# Patient Record
Sex: Female | Born: 1975
Health system: Southern US, Community
[De-identification: ages and names within clinical notes are randomized; demographics above are authoritative.]

## PROBLEM LIST (undated history)

## (undated) DIAGNOSIS — S46911D Strain of unspecified muscle, fascia and tendon at shoulder and upper arm level, right arm, subsequent encounter: Secondary | ICD-10-CM

## (undated) DIAGNOSIS — T7840XA Allergy, unspecified, initial encounter: Secondary | ICD-10-CM

## (undated) DIAGNOSIS — C801 Malignant (primary) neoplasm, unspecified: Secondary | ICD-10-CM

## (undated) DIAGNOSIS — C4491 Basal cell carcinoma of skin, unspecified: Secondary | ICD-10-CM

## (undated) HISTORY — PX: SHOULDER SURGERY: SHX246

## (undated) HISTORY — DX: Allergy, unspecified, initial encounter: T78.40XA

---
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
## 2012-11-30 HISTORY — PX: CHOLECYSTECTOMY, LAPAROSCOPIC: SHX56

## 2014-11-30 HISTORY — PX: MOHS SURGERY: SUR867

## 2015-07-01 DIAGNOSIS — C801 Malignant (primary) neoplasm, unspecified: Secondary | ICD-10-CM

## 2015-07-01 HISTORY — DX: Malignant (primary) neoplasm, unspecified: C80.1

## 2017-02-25 ENCOUNTER — Encounter: Payer: Self-pay | Admitting: Internal Medicine

## 2017-03-01 ENCOUNTER — Ambulatory Visit (INDEPENDENT_AMBULATORY_CARE_PROVIDER_SITE_OTHER): Payer: Medicaid Other | Admitting: Internal Medicine

## 2017-03-01 ENCOUNTER — Encounter: Payer: Self-pay | Admitting: Internal Medicine

## 2017-03-01 VITALS — BP 128/78 | HR 84 | Ht 66.0 in | Wt 202.0 lb

## 2017-03-01 DIAGNOSIS — Z85828 Personal history of other malignant neoplasm of skin: Secondary | ICD-10-CM | POA: Diagnosis not present

## 2017-03-01 DIAGNOSIS — S56911A Strain of unspecified muscles, fascia and tendons at forearm level, right arm, initial encounter: Secondary | ICD-10-CM

## 2017-03-01 DIAGNOSIS — S46911A Strain of unspecified muscle, fascia and tendon at shoulder and upper arm level, right arm, initial encounter: Secondary | ICD-10-CM

## 2017-03-01 NOTE — Progress Notes (Signed)
Date:  03/01/2017   Name:  Becky Clarke   DOB:  09/04/1976   MRN:  [id number]   Chief Complaint: Establish Care (New to area- used to see PCP in [place]. Would like dermatology referral for skin on ear to be checked out- needs to accept Medicaid. ) and Skin Cancer Arm Pain   The incident occurred more than 1 week ago. 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 The pain is present in the right elbow. The quality of the pain is described as aching. Pertinent negatives include no chest pain, muscle weakness or numbness. The symptoms are aggravated by lifting. She has tried NSAIDs for the symptoms. The treatment provided mild relief.   She has MOHS surgery on right ear several years ago.  Had been urged to get regular skin exams but needs a practice that accepts medicaid.   Review of Systems  Constitutional: Negative for chills and fatigue.  HENT: Negative for ear pain.   Respiratory: Negative for chest tightness, shortness of breath and wheezing.   Cardiovascular: Negative for chest pain, palpitations and leg swelling.  Gastrointestinal: Negative for abdominal pain, constipation and diarrhea.  Genitourinary: Negative for menstrual problem.  Musculoskeletal: Positive for arthralgias (right forearm).  Skin: Negative for color change and rash.  Allergic/Immunologic: Negative for environmental allergies.  Neurological: Negative for numbness and headaches.  Psychiatric/Behavioral: Negative for sleep disturbance.    There are no active problems to display for this patient.   Prior to Admission medications   Not on File    No Known Allergies  Past Surgical History:  Procedure Laterality Date  . CHOLECYSTECTOMY, LAPAROSCOPIC  2014   gall bladder removed  . MOHS SURGERY  2016   on Rt ear  . SHOULDER SURGERY Right 1994 & 2014    Social History  Substance Use Topics  . Smoking status: Never Smoker  . Smokeless tobacco: Never Used  .  Alcohol use Yes     Comment: occasional     Medication list has been reviewed and updated.   Physical Exam  Constitutional: She is oriented to person, place, and time. She appears well-developed. No distress.  HENT:  Head: Normocephalic and atraumatic.  Neck: Normal range of motion. Neck supple.  Cardiovascular: Normal rate, regular rhythm and normal heart sounds.   Pulmonary/Chest: Effort normal and breath sounds normal. No respiratory distress. She has no wheezes.  Musculoskeletal: Normal range of motion. She exhibits no edema.       Right elbow: She exhibits no swelling and no effusion. Tenderness found.       Right wrist: She exhibits normal range of motion, no tenderness and no bony tenderness.  Neurological: She is alert and oriented to person, place, and time.  Skin: Skin is warm and dry. No rash noted.  Psychiatric: She has a normal mood and affect. Her behavior is normal. Thought content normal.  Nursing note and vitals reviewed.   BP 128/78 (BP Location: Right Arm, Patient Position: Sitting, Cuff Size: Normal)   Pulse 84   Ht 5\' 6"  (1.676 m)   Wt 202 lb (91.6 kg)   LMP 02/07/2017   SpO2 100%   BMI 32.60 kg/m   Assessment and Plan: 1. Hx of skin cancer, basal cell Patient will call case worker for covered Dermatologist   2. Strain of right elbow, initial encounter Continue Advil, use ice or heat Soft brace to avoid hyperextension   No orders of the defined types were  placed in this encounter.   [name], MD [facility]  03/01/2017

## 2017-03-01 NOTE — Patient Instructions (Signed)
Breast Self-Awareness Breast self-awareness means being familiar with how your breasts look and feel. It involves checking your breasts regularly and reporting any changes to your health care provider. Practicing breast self-awareness is important. A change in your breasts can be a sign of a serious medical problem. Being familiar with how your breasts look and feel allows you to find any problems early, when treatment is more likely to be successful. All women should practice breast self-awareness, including women who have had breast implants. How to do a breast self-exam One way to learn what is normal for your breasts and whether your breasts are changing is to do a breast self-exam. To do a breast self-exam: Look for Changes   1. Remove all the clothing above your waist. 2. Stand in front of a mirror in a room with good lighting. 3. Put your hands on your hips. 4. Push your hands firmly downward. 5. Compare your breasts in the mirror. Look for differences between them (asymmetry), such as:  Differences in shape.  Differences in size.  Puckers, dips, and bumps in one breast and not the other. 6. Look at each breast for changes in your skin, such as:  Redness.  Scaly areas. 7. Look for changes in your nipples, such as:  Discharge.  Bleeding.  Dimpling.  Redness.  A change in position. Feel for Changes   Carefully feel your breasts for lumps and changes. It is best to do this while lying on your back on the floor and again while sitting or standing in the shower or tub with soapy water on your skin. Feel each breast in the following way:  Place the arm on the side of the breast you are examining above your head.  Feel your breast with the other hand.  Start in the nipple area and make  inch (2 cm) overlapping circles to feel your breast. Use the pads of your three middle fingers to do this. Apply light pressure, then medium pressure, then firm pressure. The light pressure  will allow you to feel the tissue closest to the skin. The medium pressure will allow you to feel the tissue that is a little deeper. The firm pressure will allow you to feel the tissue close to the ribs.  Continue the overlapping circles, moving downward over the breast until you feel your ribs below your breast.  Move one finger-width toward the center of the body. Continue to use the  inch (2 cm) overlapping circles to feel your breast as you move slowly up toward your collarbone.  Continue the up and down exam using all three pressures until you reach your armpit. Write Down What You Find   Write down what is normal for each breast and any changes that you find. Keep a written record with breast changes or normal findings for each breast. By writing this information down, you do not need to depend only on memory for size, tenderness, or location. Write down where you are in your menstrual cycle, if you are still menstruating. If you are having trouble noticing differences in your breasts, do not get discouraged. With time you will become more familiar with the variations in your breasts and more comfortable with the exam. How often should I examine my breasts? Examine your breasts every month. If you are breastfeeding, the best time to examine your breasts is after a feeding or after using a breast pump. If you menstruate, the best time to examine your breasts is 5-7 days  after your period is over. During your period, your breasts are lumpier, and it may be more difficult to notice changes. When should I see my health care provider? See your health care provider if you notice:  A change in shape or size of your breasts or nipples.  A change in the skin of your breast or nipples, such as a reddened or scaly area.  Unusual discharge from your nipples.  A lump or thick area that was not there before.  Pain in your breasts.  Anything that concerns you. This information is not intended to  replace advice given to you by your health care provider. Make sure you discuss any questions you have with your health care provider. Document Released: 11/16/2005 Document Revised: 04/23/2016 Document Reviewed: 10/06/2015 Elsevier Interactive Patient Education  2017 [employer].

## 2017-03-11 ENCOUNTER — Other Ambulatory Visit: Payer: Self-pay | Admitting: Internal Medicine

## 2017-03-11 ENCOUNTER — Encounter: Payer: Self-pay | Admitting: Internal Medicine

## 2017-03-11 DIAGNOSIS — Z85828 Personal history of other malignant neoplasm of skin: Secondary | ICD-10-CM

## 2017-03-25 ENCOUNTER — Ambulatory Visit (INDEPENDENT_AMBULATORY_CARE_PROVIDER_SITE_OTHER): Payer: Medicaid Other | Admitting: Internal Medicine

## 2017-03-25 ENCOUNTER — Encounter: Payer: Self-pay | Admitting: Internal Medicine

## 2017-03-25 VITALS — BP 110/76 | HR 65 | Ht 66.0 in | Wt 195.6 lb

## 2017-03-25 DIAGNOSIS — S46911D Strain of unspecified muscle, fascia and tendon at shoulder and upper arm level, right arm, subsequent encounter: Secondary | ICD-10-CM

## 2017-03-25 DIAGNOSIS — Z1231 Encounter for screening mammogram for malignant neoplasm of breast: Secondary | ICD-10-CM

## 2017-03-25 DIAGNOSIS — S56911D Strain of unspecified muscles, fascia and tendons at forearm level, right arm, subsequent encounter: Secondary | ICD-10-CM

## 2017-03-25 DIAGNOSIS — Z0000 Encounter for general adult medical examination without abnormal findings: Secondary | ICD-10-CM | POA: Diagnosis not present

## 2017-03-25 DIAGNOSIS — Z1239 Encounter for other screening for malignant neoplasm of breast: Secondary | ICD-10-CM

## 2017-03-25 HISTORY — DX: Strain of unspecified muscles, fascia and tendons at forearm level, right arm, subsequent encounter: S56.911D

## 2017-03-25 HISTORY — DX: Strain of unspecified muscle, fascia and tendon at shoulder and upper arm level, right arm, subsequent encounter: S46.911D

## 2017-03-25 LAB — POCT URINALYSIS DIPSTICK
Bilirubin, UA: NEGATIVE
Blood, UA: NEGATIVE
Glucose, UA: NEGATIVE
Ketones, UA: NEGATIVE
Leukocytes, UA: NEGATIVE
Nitrite, UA: NEGATIVE
Protein, UA: NEGATIVE
Spec Grav, UA: <=1.005 — AB (ref 1.010–1.025)
Urobilinogen, UA: 0.2 E.U./dL
pH, UA: 7.5 (ref 5.0–8.0)

## 2017-03-25 NOTE — Progress Notes (Signed)
Date:  03/25/2017   Name:  Becky Clarke   DOB:  August 22, 1976   MRN:  [id number]   Chief Complaint: Annual Exam (Wants pap today. Unsure of when last pap was. Breast Exam. ) Becky Clarke is a 41 y.o. female who presents today for her Complete Annual Exam. She feels well. She reports exercising - started back walking. She reports she is sleeping well. Her elbow is improving but still painful with certain movements and extension.    Review of Systems  Constitutional: Negative for chills, fatigue and fever.  HENT: Negative for congestion, hearing loss, tinnitus, trouble swallowing and voice change.   Eyes: Negative for visual disturbance.  Respiratory: Negative for cough, chest tightness, shortness of breath and wheezing.   Cardiovascular: Negative for chest pain, palpitations and leg swelling.  Gastrointestinal: Negative for abdominal pain, constipation, diarrhea and vomiting.  Endocrine: Negative for polydipsia and polyuria.  Genitourinary: Negative for dysuria, frequency, genital sores, menstrual problem, vaginal bleeding and vaginal discharge.  Musculoskeletal: Positive for arthralgias (right elbow). Negative for gait problem and joint swelling.  Skin: Negative for color change and rash.  Allergic/Immunologic: Positive for environmental allergies.  Neurological: Negative for dizziness, tremors, light-headedness and headaches.  Hematological: Negative for adenopathy. Does not bruise/bleed easily.  Psychiatric/Behavioral: Negative for dysphoric mood and sleep disturbance. The patient is not nervous/anxious.     Patient Active Problem List   Diagnosis Date Noted  . History of basal cell cancer 03/11/2017    Prior to Admission medications   Not on File    No Known Allergies  Past Surgical History:  Procedure Laterality Date  . CHOLECYSTECTOMY, LAPAROSCOPIC  2014   gall bladder removed  . MOHS SURGERY  2016   on Rt ear  . SHOULDER SURGERY Right 1994 & 2014     Social History  Substance Use Topics  . Smoking status: Never Smoker  . Smokeless tobacco: Never Used  . Alcohol use Yes     Comment: occasional     Medication list has been reviewed and updated.   Physical Exam  Constitutional: She is oriented to person, place, and time. She appears well-developed and well-nourished. No distress.  HENT:  Head: Normocephalic and atraumatic.  Right Ear: Tympanic membrane and ear canal normal.  Left Ear: Tympanic membrane and ear canal normal.  Nose: Right sinus exhibits no maxillary sinus tenderness. Left sinus exhibits no maxillary sinus tenderness.  Mouth/Throat: Uvula is midline and oropharynx is clear and moist.  Eyes: Conjunctivae and EOM are normal. Right eye exhibits no discharge. Left eye exhibits no discharge. No scleral icterus.  Neck: Normal range of motion. Carotid bruit is not present. No erythema present. No thyromegaly present.  Cardiovascular: Normal rate, regular rhythm, normal heart sounds and normal pulses.   Pulmonary/Chest: Effort normal. No respiratory distress. She has no wheezes. Right breast exhibits no mass, no nipple discharge, no skin change and no tenderness. Left breast exhibits no mass, no nipple discharge, no skin change and no tenderness.  Abdominal: Soft. Bowel sounds are normal. There is no hepatosplenomegaly. There is no tenderness. There is no CVA tenderness.  Genitourinary: Vagina normal and uterus normal. There is no tenderness, lesion or injury on the right labia. There is no tenderness, lesion or injury on the left labia. Cervix exhibits no motion tenderness, no discharge and no friability. Right adnexum displays no mass, no tenderness and no fullness. Left adnexum displays no mass, no tenderness and no fullness.  Genitourinary Comments: Few tiny nabothian cysts  at 3 and 4 oclock  Musculoskeletal: Normal range of motion.       Right elbow: She exhibits normal range of motion, no swelling and no effusion.   Lymphadenopathy:    She has no cervical adenopathy.    She has no axillary adenopathy.  Neurological: She is alert and oriented to person, place, and time. She has normal reflexes. No cranial nerve deficit or sensory deficit.  Skin: Skin is warm, dry and intact. No rash noted.  Psychiatric: She has a normal mood and affect. Her speech is normal and behavior is normal. Thought content normal.  Nursing note and vitals reviewed.   BP 110/76 (BP Location: Right Arm, Patient Position: Sitting, Cuff Size: Large)   Pulse 65   Ht 5\' 6"  (1.676 m)   Wt 195 lb 9.6 oz (88.7 kg)   LMP 03/12/2017 (Exact Date)   SpO2 100%   BMI 31.57 kg/m   Assessment and Plan: 1. Annual physical exam Normal exam other than weight - continue active lifestyle and work on diet - CBC with Differential/Platelet - Comprehensive metabolic panel - Lipid panel - TSH - POCT urinalysis dipstick - Pap IG and HPV (high risk) DNA detection  2. Breast cancer screening Normal exam - due for annual screening - MM DIGITAL SCREENING BILATERAL; Future  3. Elbow strain, right, subsequent encounter Improving - can refer to Ortho if needed   No orders of the defined types were placed in this encounter.   Becky Maidens, MD [facility]  03/25/2017

## 2017-03-25 NOTE — Patient Instructions (Signed)
Call to schedule Mammogram  [phone]   Breast Self-Awareness Breast self-awareness means being familiar with how your breasts look and feel. It involves checking your breasts regularly and reporting any changes to your health care provider. Practicing breast self-awareness is important. A change in your breasts can be a sign of a serious medical problem. Being familiar with how your breasts look and feel allows you to find any problems early, when treatment is more likely to be successful. All women should practice breast self-awareness, including women who have had breast implants. How to do a breast self-exam One way to learn what is normal for your breasts and whether your breasts are changing is to do a breast self-exam. To do a breast self-exam: Look for Changes   1. Remove all the clothing above your waist. 2. Stand in front of a mirror in a room with good lighting. 3. Put your hands on your hips. 4. Push your hands firmly downward. 5. Compare your breasts in the mirror. Look for differences between them (asymmetry), such as:  Differences in shape.  Differences in size.  Puckers, dips, and bumps in one breast and not the other. 6. Look at each breast for changes in your skin, such as:  Redness.  Scaly areas. 7. Look for changes in your nipples, such as:  Discharge.  Bleeding.  Dimpling.  Redness.  A change in position. Feel for Changes   Carefully feel your breasts for lumps and changes. It is best to do this while lying on your back on the floor and again while sitting or standing in the shower or tub with soapy water on your skin. Feel each breast in the following way:  Place the arm on the side of the breast you are examining above your head.  Feel your breast with the other hand.  Start in the nipple area and make  inch (2 cm) overlapping circles to feel your breast. Use the pads of your three middle fingers to do this. Apply light pressure, then medium  pressure, then firm pressure. The light pressure will allow you to feel the tissue closest to the skin. The medium pressure will allow you to feel the tissue that is a little deeper. The firm pressure will allow you to feel the tissue close to the ribs.  Continue the overlapping circles, moving downward over the breast until you feel your ribs below your breast.  Move one finger-width toward the center of the body. Continue to use the  inch (2 cm) overlapping circles to feel your breast as you move slowly up toward your collarbone.  Continue the up and down exam using all three pressures until you reach your armpit. Write Down What You Find   Write down what is normal for each breast and any changes that you find. Keep a written record with breast changes or normal findings for each breast. By writing this information down, you do not need to depend only on memory for size, tenderness, or location. Write down where you are in your menstrual cycle, if you are still menstruating. If you are having trouble noticing differences in your breasts, do not get discouraged. With time you will become more familiar with the variations in your breasts and more comfortable with the exam. How often should I examine my breasts? Examine your breasts every month. If you are breastfeeding, the best time to examine your breasts is after a feeding or after using a breast pump. If you menstruate, the best  time to examine your breasts is 5-7 days after your period is over. During your period, your breasts are lumpier, and it may be more difficult to notice changes. When should I see my health care provider? See your health care provider if you notice:  A change in shape or size of your breasts or nipples.  A change in the skin of your breast or nipples, such as a reddened or scaly area.  Unusual discharge from your nipples.  A lump or thick area that was not there before.  Pain in your breasts.  Anything that  concerns you. This information is not intended to replace advice given to you by your health care provider. Make sure you discuss any questions you have with your health care provider. Document Released: 11/16/2005 Document Revised: 04/23/2016 Document Reviewed: 10/06/2015 Elsevier Interactive Patient Education  2017 [employer].

## 2017-03-26 LAB — CBC WITH DIFFERENTIAL/PLATELET
Basophils Absolute: 0 10*3/uL (ref 0.0–0.2)
Basos: 0 %
EOS (ABSOLUTE): 0.1 10*3/uL (ref 0.0–0.4)
Eos: 1 %
Hematocrit: 43 % (ref 34.0–46.6)
Hemoglobin: 14.6 g/dL (ref 11.1–15.9)
Immature Grans (Abs): 0 10*3/uL (ref 0.0–0.1)
Immature Granulocytes: 0 %
Lymphocytes Absolute: 2.4 10*3/uL (ref 0.7–3.1)
Lymphs: 37 %
MCH: 31.4 pg (ref 26.6–33.0)
MCHC: 34 g/dL (ref 31.5–35.7)
MCV: 93 fL (ref 79–97)
Monocytes Absolute: 0.4 10*3/uL (ref 0.1–0.9)
Monocytes: 6 %
Neutrophils Absolute: 3.6 10*3/uL (ref 1.4–7.0)
Neutrophils: 56 %
Platelets: 289 10*3/uL (ref 150–379)
RBC: 4.65 x10E6/uL (ref 3.77–5.28)
RDW: 13.1 % (ref 12.3–15.4)
WBC: 6.5 10*3/uL (ref 3.4–10.8)

## 2017-03-26 LAB — COMPREHENSIVE METABOLIC PANEL
ALT: 23 IU/L (ref 0–32)
AST: 19 IU/L (ref 0–40)
Albumin/Globulin Ratio: 1.6 (ref 1.2–2.2)
Albumin: 4.6 g/dL (ref 3.5–5.5)
Alkaline Phosphatase: 59 IU/L (ref 39–117)
BUN/Creatinine Ratio: 9 (ref 9–23)
BUN: 8 mg/dL (ref 6–24)
Bilirubin Total: 0.6 mg/dL (ref 0.0–1.2)
CO2: 21 mmol/L (ref 18–29)
Calcium: 9.5 mg/dL (ref 8.7–10.2)
Chloride: 100 mmol/L (ref 96–106)
Creatinine, Ser: 0.87 mg/dL (ref 0.57–1.00)
GFR calc Af Amer: 94 mL/min/{1.73_m2} (ref >59)
GFR calc non Af Amer: 82 mL/min/{1.73_m2} (ref >59)
Globulin, Total: 2.9 g/dL (ref 1.5–4.5)
Glucose: 75 mg/dL (ref 65–99)
Potassium: 3.9 mmol/L (ref 3.5–5.2)
Sodium: 140 mmol/L (ref 134–144)
Total Protein: 7.5 g/dL (ref 6.0–8.5)

## 2017-03-26 LAB — LIPID PANEL
Chol/HDL Ratio: 2.8 ratio (ref 0.0–4.4)
Cholesterol, Total: 197 mg/dL (ref 100–199)
HDL: 71 mg/dL (ref >39)
LDL Calculated: 108 mg/dL — ABNORMAL HIGH (ref 0–99)
Triglycerides: 90 mg/dL (ref 0–149)
VLDL Cholesterol Cal: 18 mg/dL (ref 5–40)

## 2017-03-26 LAB — TSH: TSH: 1.66 u[IU]/mL (ref 0.450–4.500)

## 2017-03-30 LAB — PAP IG AND HPV HIGH-RISK
HPV, high-risk: NEGATIVE
PAP Smear Comment: 0

## 2017-04-01 ENCOUNTER — Ambulatory Visit
Admission: RE | Admit: 2017-04-01 | Discharge: 2017-04-01 | Disposition: A | Discharge status: Home / Self Care | Payer: Self-pay | Source: Ambulatory Visit | Attending: Internal Medicine | Admitting: Internal Medicine

## 2017-04-01 ENCOUNTER — Encounter: Payer: Self-pay | Admitting: Radiology

## 2017-04-01 DIAGNOSIS — Z1239 Encounter for other screening for malignant neoplasm of breast: Secondary | ICD-10-CM

## 2017-04-01 DIAGNOSIS — Z1231 Encounter for screening mammogram for malignant neoplasm of breast: Secondary | ICD-10-CM | POA: Insufficient documentation

## 2017-04-01 HISTORY — DX: Malignant (primary) neoplasm, unspecified: C80.1

## 2017-08-22 IMAGING — MG MM DIGITAL SCREENING BILAT W/ CAD
4 series · 4 of 4 positions shown · non-contrast
Comparison: None.

CLINICAL DATA: Screening.

EXAM:
DIGITAL SCREENING BILATERAL MAMMOGRAM WITH CAD

[R CC]
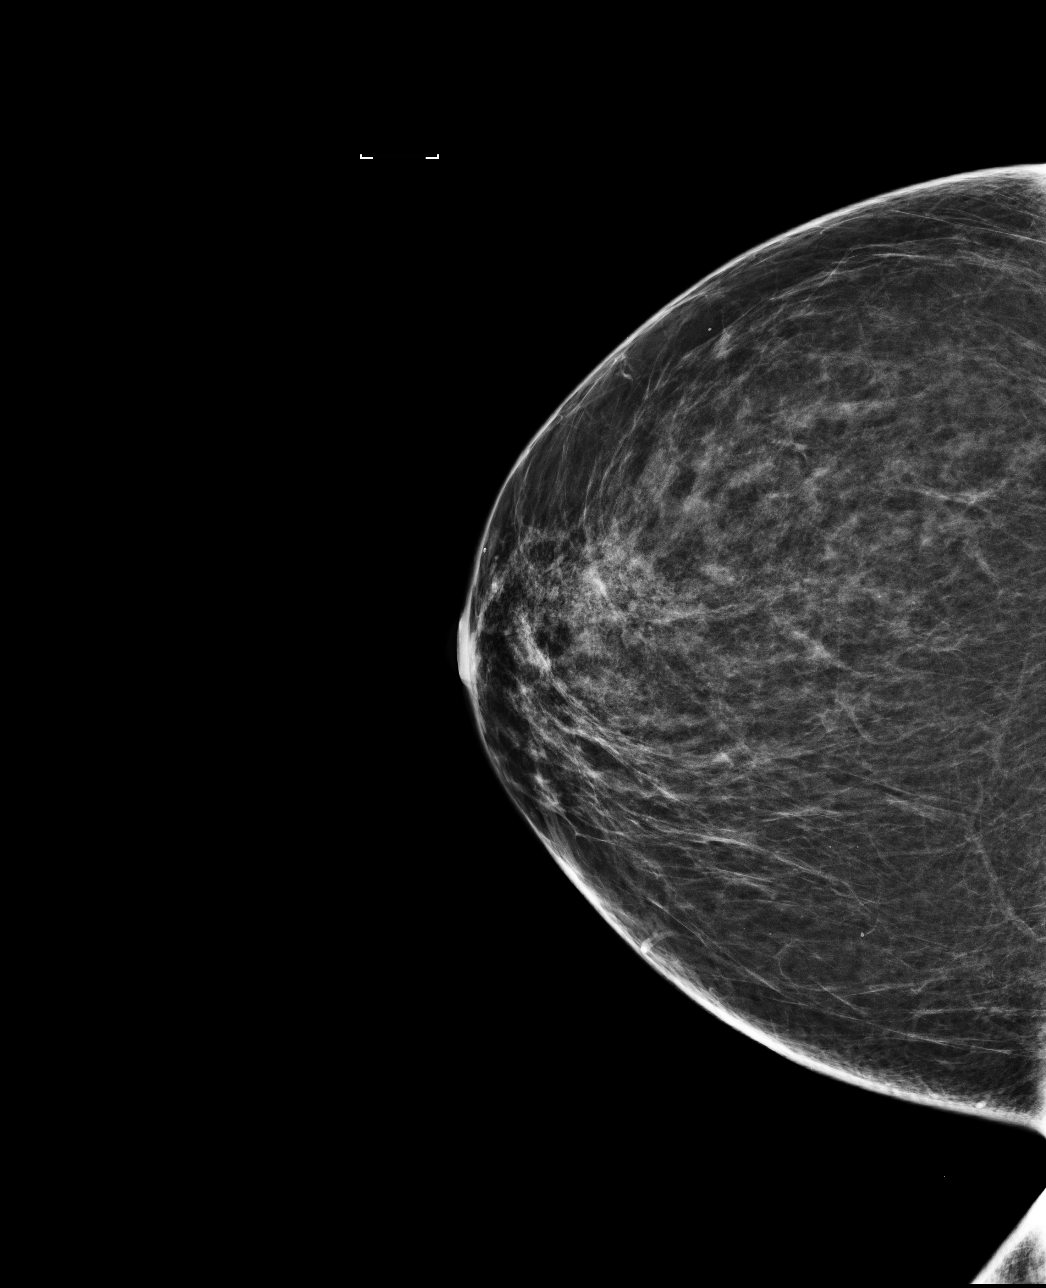

[L CC]
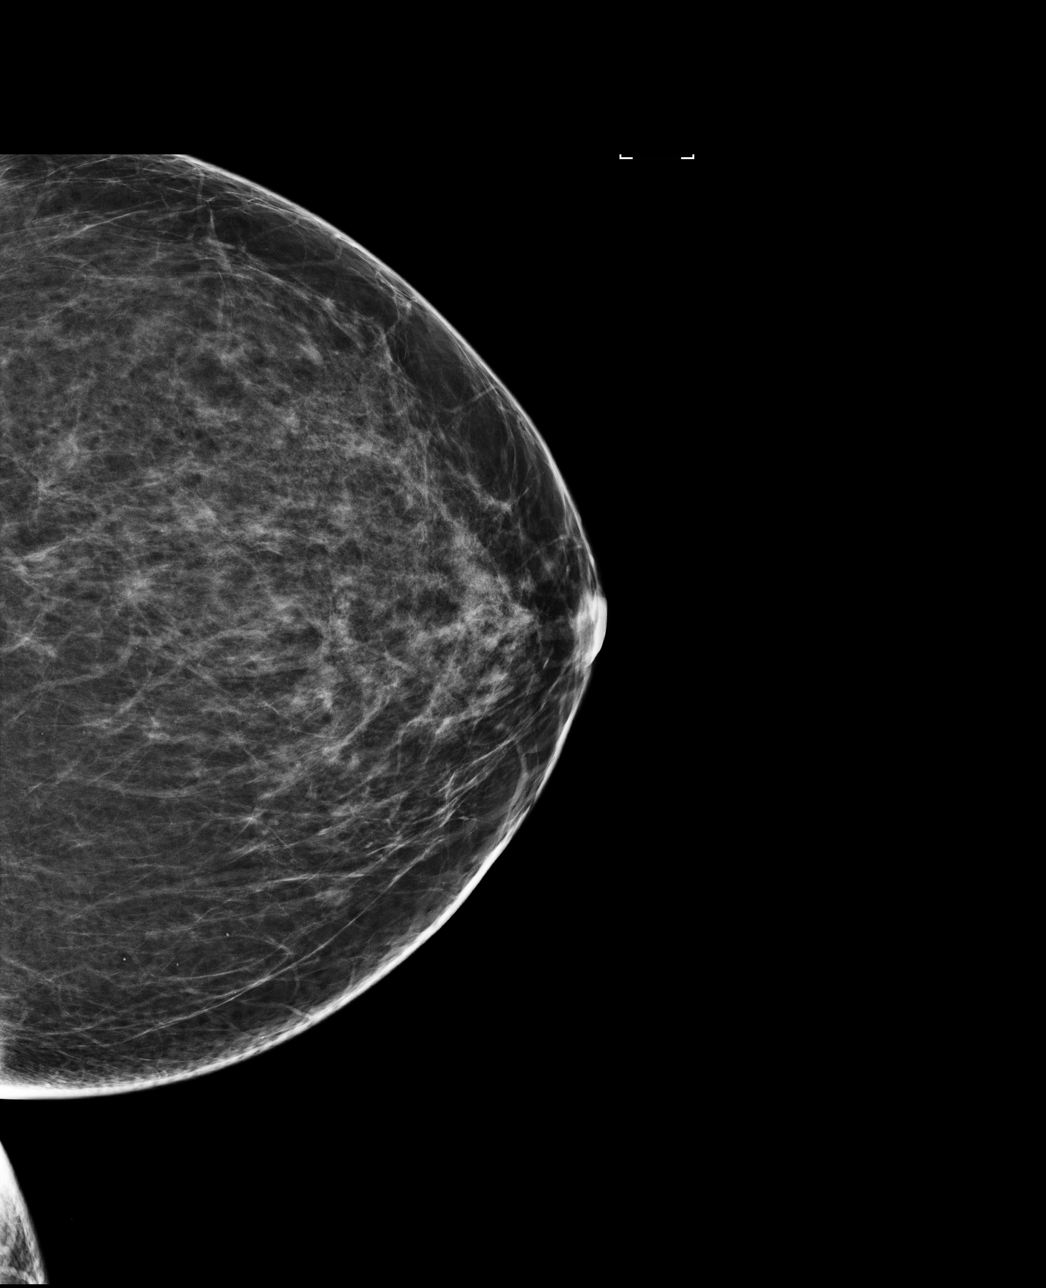

[R MLO]
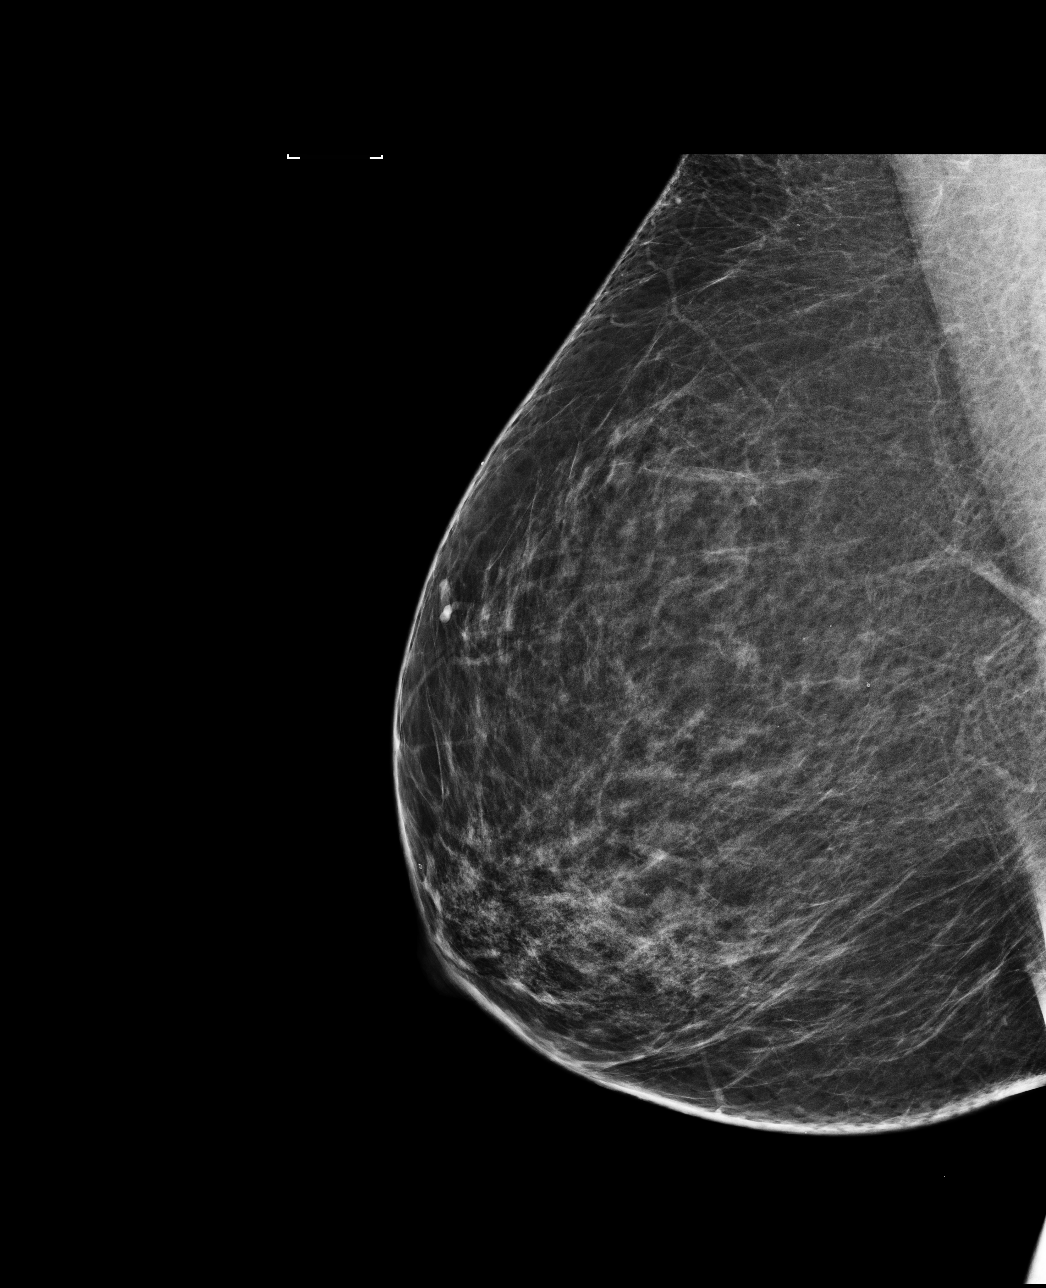

[L MLO]
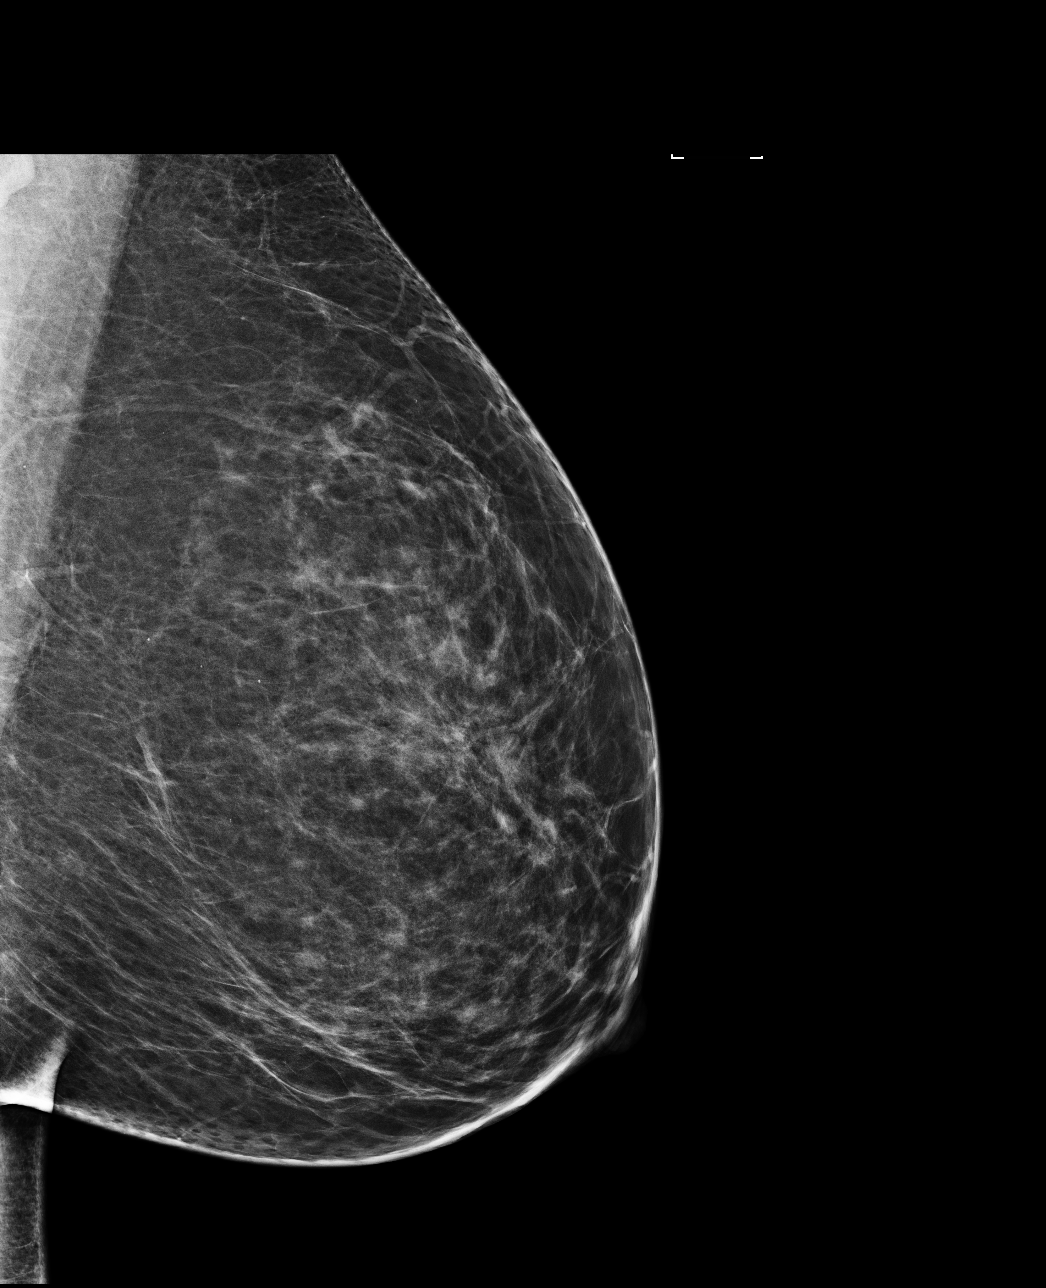

[4 of 4 positions shown; findings below may reference images not displayed]

ACR Breast Density Category b: There are scattered areas of
fibroglandular density.
FINDINGS: There are no findings suspicious for malignancy. Images were
processed with CAD.
IMPRESSION: No mammographic evidence of malignancy. A result letter of this
screening mammogram will be mailed directly to the patient.

RECOMMENDATION:
Screening mammogram in one year. (Code:[id number])

BI-RADS CATEGORY  1: Negative.

## 2018-02-28 DIAGNOSIS — C4491 Basal cell carcinoma of skin, unspecified: Secondary | ICD-10-CM

## 2018-02-28 HISTORY — DX: Basal cell carcinoma of skin, unspecified: C44.91

## 2018-03-02 ENCOUNTER — Encounter: Payer: Self-pay | Admitting: Internal Medicine

## 2018-03-02 ENCOUNTER — Ambulatory Visit: Payer: Medicaid Other | Admitting: Internal Medicine

## 2018-03-02 VITALS — BP 120/78 | HR 69 | Ht 66.0 in | Wt 210.8 lb

## 2018-03-02 DIAGNOSIS — D485 Neoplasm of uncertain behavior of skin: Secondary | ICD-10-CM

## 2018-03-02 DIAGNOSIS — N951 Menopausal and female climacteric states: Secondary | ICD-10-CM | POA: Diagnosis not present

## 2018-03-02 DIAGNOSIS — G4700 Insomnia, unspecified: Secondary | ICD-10-CM | POA: Diagnosis not present

## 2018-03-02 MED ORDER — ZOLPIDEM TARTRATE ER 12.5 MG PO TBCR: 12.5000 mg | EXTENDED_RELEASE_TABLET | Freq: Every evening | ORAL | 1 refills | Status: DC | PRN

## 2018-03-02 NOTE — Progress Notes (Signed)
Date:  03/02/2018   Name:  Becky Clarke   DOB:  1976-08-25   MRN:  [id number]   Chief Complaint: Menopause (X 3 weeks. Can't sleep at night and waking up in sweats. Last period Feb 24th. Still has not started. Period is being irreg. Never happened before.  )  Insomnia  Primary symptoms: frequent awakening.  The current episode started one month. The onset quality is sudden. The symptoms are aggravated by menstrual cycle (had no menses for about 2 months then a shorter normal period a month ago.  Sweats started then). Past treatments include nothing.   Skin lesion - smooth raised macule on inner left lower leg.  Has been there some time but may be getting larger.  Has hx of BCCA so is concerned.  Review of Systems  Constitutional: Positive for diaphoresis, fatigue and unexpected weight change. Negative for chills and fever.  Respiratory: Negative for chest tightness and shortness of breath.   Cardiovascular: Negative for chest pain and palpitations.  Gastrointestinal: Negative for abdominal pain.  Genitourinary: Positive for menstrual problem. Negative for dysuria.  Musculoskeletal: Negative for arthralgias.  Skin:       Lesion on left leg  Psychiatric/Behavioral: The patient has insomnia.     Patient Active Problem List   Diagnosis Date Noted  . Elbow strain, right, subsequent encounter 03/25/2017  . History of basal cell cancer 03/11/2017    Prior to Admission medications   Medication Sig Start Date End Date Taking? Authorizing Provider  cetirizine (ZYRTEC) 10 MG tablet Take 10 mg by mouth daily.   Yes [provider]    No Known Allergies  Past Surgical History:  Procedure Laterality Date  . CHOLECYSTECTOMY, LAPAROSCOPIC  2014   gall bladder removed  . MOHS SURGERY  2016   on Rt ear  . SHOULDER SURGERY Right 1994 & 2014    Social History   Tobacco Use  . Smoking status: Never Smoker  . Smokeless tobacco: Never Used  Substance Use Topics  .  Alcohol use: Yes    Comment: occasional  . Drug use: No     Medication list has been reviewed and updated.  PHQ 2/9 Scores 03/25/2017  PHQ - 2 Score 0    Physical Exam  Constitutional: She is oriented to person, place, and time. She appears well-developed. No distress.  HENT:  Head: Normocephalic and atraumatic.  Neck: Normal range of motion. Neck supple. No thyromegaly present.  Cardiovascular: Normal rate, regular rhythm and normal heart sounds.  Pulmonary/Chest: Effort normal and breath sounds normal. No respiratory distress. She has no wheezes.  Musculoskeletal: Normal range of motion.  Neurological: She is alert and oriented to person, place, and time.  Skin: Skin is warm and dry. No rash noted.     Psychiatric: She has a normal mood and affect. Her behavior is normal. Thought content normal.  Nursing note and vitals reviewed.   BP 120/78   Pulse 69   Ht 5\' 6"  (1.676 m)   Wt 210 lb 12.8 oz (95.6 kg)   LMP 01/23/2018 (Exact Date)   SpO2 100%   BMI 34.02 kg/m   Assessment and Plan: 1. Menopausal flushing Options include watchful waiting, sleep medication or HRT Pt would like to try sleep meds If not beneficial, return to discuss HRT  2. Neoplasm of uncertain behavior of skin - Ambulatory referral to Dermatology  3. Insomnia disorder related to known organic factor Will try ambien nightly - zolpidem (AMBIEN CR) 12.5  MG CR tablet; Take 1 tablet (12.5 mg total) by mouth at bedtime as needed for sleep.  Dispense: 30 tablet; Refill: 1   Meds ordered this encounter  Medications  . zolpidem (AMBIEN CR) 12.5 MG CR tablet    Sig: Take 1 tablet (12.5 mg total) by mouth at bedtime as needed for sleep.    Dispense:  30 tablet    Refill:  1    Partially dictated using Editor, commissioning. Any errors are unintentional.  [name], MD [facility]  03/02/2018

## 2018-03-08 ENCOUNTER — Encounter: Payer: Self-pay | Admitting: Internal Medicine

## 2018-03-11 ENCOUNTER — Telehealth: Payer: Self-pay

## 2018-03-11 ENCOUNTER — Other Ambulatory Visit: Payer: Self-pay | Admitting: Internal Medicine

## 2018-03-11 DIAGNOSIS — G4700 Insomnia, unspecified: Secondary | ICD-10-CM

## 2018-03-11 MED ORDER — TEMAZEPAM 15 MG PO CAPS: 15.0000 mg | ORAL_CAPSULE | Freq: Every evening | ORAL | 0 refills | Status: DC | PRN

## 2018-03-11 NOTE — Telephone Encounter (Signed)
Patient called stating she has still not heard about Ambien PA from her insurance. States she is not sleeping and needs to sleep. Wants to know if we can send in something different.  Called her pharmacy to check status of PA. They informed me it was denied on 03/09/2018. The preferred drugs need to be tried first.   The preferred drugs are: Temazepam 15 mg or 30 mg capsules, or Flurazepam.  Please Advise.

## 2018-03-11 NOTE — Telephone Encounter (Signed)
Sent in one month of Temazepam - take 15 minutes before bedtime.

## 2018-03-11 NOTE — Telephone Encounter (Signed)
Patient informed on VM. She did not answer phone.

## 2018-04-04 ENCOUNTER — Other Ambulatory Visit: Payer: Self-pay | Admitting: Internal Medicine

## 2018-04-04 DIAGNOSIS — Z1231 Encounter for screening mammogram for malignant neoplasm of breast: Secondary | ICD-10-CM

## 2018-04-13 ENCOUNTER — Other Ambulatory Visit: Payer: Self-pay | Admitting: Internal Medicine

## 2018-04-13 DIAGNOSIS — G4700 Insomnia, unspecified: Secondary | ICD-10-CM

## 2018-04-15 MED ORDER — TEMAZEPAM 15 MG PO CAPS: 15.0000 mg | ORAL_CAPSULE | Freq: Every evening | ORAL | 5 refills | Status: DC | PRN

## 2018-04-15 NOTE — Telephone Encounter (Signed)
Patient requesting refill for Temazepam medication. Please Advise.

## 2018-04-18 ENCOUNTER — Ambulatory Visit
Admission: RE | Admit: 2018-04-18 | Discharge: 2018-04-18 | Disposition: A | Discharge status: Home / Self Care | Payer: Medicaid Other | Source: Ambulatory Visit | Attending: Internal Medicine | Admitting: Internal Medicine

## 2018-04-18 DIAGNOSIS — Z1231 Encounter for screening mammogram for malignant neoplasm of breast: Secondary | ICD-10-CM | POA: Insufficient documentation

## 2018-04-18 HISTORY — DX: Basal cell carcinoma of skin, unspecified: C44.91

## 2018-04-21 ENCOUNTER — Encounter: Payer: Self-pay | Admitting: Internal Medicine

## 2018-04-22 ENCOUNTER — Ambulatory Visit: Payer: Medicaid Other | Admitting: Internal Medicine

## 2018-04-22 ENCOUNTER — Encounter: Payer: Self-pay | Admitting: Internal Medicine

## 2018-04-22 ENCOUNTER — Other Ambulatory Visit: Payer: Self-pay

## 2018-04-22 VITALS — BP 112/78 | HR 78 | Temp 97.8°F | Resp 16 | Ht 66.0 in | Wt 210.0 lb

## 2018-04-22 DIAGNOSIS — N3000 Acute cystitis without hematuria: Secondary | ICD-10-CM | POA: Diagnosis not present

## 2018-04-22 LAB — POCT URINALYSIS DIPSTICK
Bilirubin, UA: NEGATIVE
Glucose, UA: NEGATIVE
Ketones, UA: NEGATIVE
Nitrite, UA: NEGATIVE
Protein, UA: NEGATIVE
Spec Grav, UA: 1.025 (ref 1.010–1.025)
Urobilinogen, UA: 0.2 E.U./dL
pH, UA: 6 (ref 5.0–8.0)

## 2018-04-22 MED ORDER — SULFAMETHOXAZOLE-TRIMETHOPRIM 800-160 MG PO TABS: 1.0000 | ORAL_TABLET | Freq: Two times a day (BID) | ORAL | 0 refills | Status: AC

## 2018-04-22 NOTE — Progress Notes (Signed)
Date:  04/22/2018   Name:  Becky Clarke   DOB:  Apr 25, 1976   MRN:  [id number]   Chief Complaint: Dysuria Urinary Tract Infection   This is a new problem. The current episode started in the past 7 days. The problem occurs every urination. The quality of the pain is described as burning. The pain is moderate. There has been no fever. Associated symptoms include frequency and urgency. Pertinent negatives include no chills or hematuria.     Review of Systems  Constitutional: Negative for chills, fatigue and fever.  Respiratory: Negative for cough, chest tightness, shortness of breath and wheezing.   Cardiovascular: Negative for chest pain and palpitations.  Genitourinary: Positive for dysuria, frequency and urgency. Negative for hematuria.    Patient Active Problem List   Diagnosis Date Noted  . Menopausal flushing 03/02/2018  . Insomnia disorder related to known organic factor 03/02/2018  . Elbow strain, right, subsequent encounter 03/25/2017  . History of basal cell cancer 03/11/2017    Prior to Admission medications   Medication Sig Start Date End Date Taking? Authorizing Provider  cetirizine (ZYRTEC) 10 MG tablet Take 10 mg by mouth daily.   Yes [provider]  Methenamine-Sodium Salicylate 259-563.8 MG TABS Take 2 tablets by mouth 3 (three) times daily.   Yes [provider]  temazepam (RESTORIL) 15 MG capsule Take 1 capsule (15 mg total) by mouth at bedtime as needed for sleep. 04/15/18  Yes Glean Hess, MD    No Known Allergies  Past Surgical History:  Procedure Laterality Date  . CHOLECYSTECTOMY, LAPAROSCOPIC  2014   gall bladder removed  . MOHS SURGERY  2016   on Rt ear  . SHOULDER SURGERY Right 1994 & 2014    Social History   Tobacco Use  . Smoking status: Never Smoker  . Smokeless tobacco: Never Used  Substance Use Topics  . Alcohol use: Yes    Comment: occasional  . Drug use: No     Medication list has been reviewed and  updated.  Current Meds  Medication Sig  . cetirizine (ZYRTEC) 10 MG tablet Take 10 mg by mouth daily.  . Methenamine-Sodium Salicylate 756-433.2 MG TABS Take 2 tablets by mouth 3 (three) times daily.  . temazepam (RESTORIL) 15 MG capsule Take 1 capsule (15 mg total) by mouth at bedtime as needed for sleep.  . [DISCONTINUED] zolpidem (AMBIEN CR) 12.5 MG CR tablet Take 1 tablet (12.5 mg total) by mouth at bedtime as needed for sleep.    PHQ 2/9 Scores 04/22/2018 03/25/2017  PHQ - 2 Score 0 0    Physical Exam  Constitutional: She appears well-developed and well-nourished.  Cardiovascular: Normal rate, regular rhythm and normal heart sounds.  Pulmonary/Chest: Effort normal and breath sounds normal. No respiratory distress.  Abdominal: Soft. Bowel sounds are normal. There is tenderness in the suprapubic area. There is no rebound, no guarding and no CVA tenderness.  Psychiatric: She has a normal mood and affect.  Nursing note and vitals reviewed.   BP 112/78   Pulse 78   Temp 97.8 F (36.6 C) (Oral)   Resp 16   Ht 5\' 6"  (1.676 m)   Wt 210 lb (95.3 kg)   SpO2 100%   BMI 33.89 kg/m   Assessment and Plan: 1. Acute cystitis without hematuria Continue to push fluids - sulfamethoxazole-trimethoprim (BACTRIM DS,SEPTRA DS) 800-160 MG tablet; Take 1 tablet by mouth 2 (two) times daily for 7 days.  Dispense: 14 tablet; Refill:  0 - POCT urinalysis dipstick   Meds ordered this encounter  Medications  . sulfamethoxazole-trimethoprim (BACTRIM DS,SEPTRA DS) 800-160 MG tablet    Sig: Take 1 tablet by mouth 2 (two) times daily for 7 days.    Dispense:  14 tablet    Refill:  0    Partially dictated using Editor, commissioning. Any errors are unintentional.  [name], MD [facility]  04/22/2018   There are no diagnoses linked to this encounter.

## 2018-05-19 ENCOUNTER — Encounter: Payer: Self-pay | Admitting: Internal Medicine

## 2018-12-01 ENCOUNTER — Other Ambulatory Visit: Payer: Self-pay | Admitting: Internal Medicine

## 2018-12-01 DIAGNOSIS — G4700 Insomnia, unspecified: Secondary | ICD-10-CM

## 2018-12-01 NOTE — Telephone Encounter (Signed)
Will call back to set up appt 

## 2019-06-10 ENCOUNTER — Other Ambulatory Visit: Payer: Self-pay | Admitting: Internal Medicine

## 2019-06-10 DIAGNOSIS — G4700 Insomnia, unspecified: Secondary | ICD-10-CM

## 2019-07-25 ENCOUNTER — Encounter: Payer: Medicaid Other | Admitting: Internal Medicine

## 2019-08-13 ENCOUNTER — Encounter: Payer: Self-pay | Admitting: Internal Medicine

## 2019-08-14 ENCOUNTER — Other Ambulatory Visit: Payer: Self-pay | Admitting: Internal Medicine

## 2019-08-14 ENCOUNTER — Telehealth: Payer: Self-pay

## 2019-08-14 DIAGNOSIS — G4700 Insomnia, unspecified: Secondary | ICD-10-CM

## 2019-08-14 MED ORDER — TEMAZEPAM 15 MG PO CAPS: 15.0000 mg | ORAL_CAPSULE | Freq: Every evening | ORAL | 0 refills | Status: DC | PRN

## 2019-08-14 NOTE — Telephone Encounter (Signed)
Patient informed. 

## 2019-08-14 NOTE — Telephone Encounter (Signed)
Patient has a apt in beginning of October. Asking for small RF for Temazepam because she has not slept in 4 nights.  Please advise?

## 2019-08-14 NOTE — Telephone Encounter (Signed)
I sent in a refill.

## 2019-09-01 ENCOUNTER — Other Ambulatory Visit: Payer: Self-pay

## 2019-09-01 ENCOUNTER — Other Ambulatory Visit
Admission: RE | Admit: 2019-09-01 | Discharge: 2019-09-01 | Disposition: A | Discharge status: Home / Self Care | Payer: 59 | Attending: Internal Medicine | Admitting: Internal Medicine

## 2019-09-01 ENCOUNTER — Encounter: Payer: Self-pay | Admitting: Internal Medicine

## 2019-09-01 ENCOUNTER — Ambulatory Visit (INDEPENDENT_AMBULATORY_CARE_PROVIDER_SITE_OTHER): Payer: 59 | Admitting: Internal Medicine

## 2019-09-01 VITALS — BP 116/82 | HR 66 | Ht 66.0 in | Wt 216.0 lb

## 2019-09-01 DIAGNOSIS — Z1231 Encounter for screening mammogram for malignant neoplasm of breast: Secondary | ICD-10-CM | POA: Diagnosis not present

## 2019-09-01 DIAGNOSIS — K591 Functional diarrhea: Secondary | ICD-10-CM | POA: Diagnosis not present

## 2019-09-01 DIAGNOSIS — G4700 Insomnia, unspecified: Secondary | ICD-10-CM | POA: Diagnosis not present

## 2019-09-01 DIAGNOSIS — Z0000 Encounter for general adult medical examination without abnormal findings: Secondary | ICD-10-CM

## 2019-09-01 LAB — COMPREHENSIVE METABOLIC PANEL
ALT: 16 U/L (ref 0–44)
AST: 19 U/L (ref 15–41)
Albumin: 3.9 g/dL (ref 3.5–5.0)
Alkaline Phosphatase: 59 U/L (ref 38–126)
Anion gap: 9 (ref 5–15)
BUN: 9 mg/dL (ref 6–20)
CO2: 26 mmol/L (ref 22–32)
Calcium: 9.3 mg/dL (ref 8.9–10.3)
Chloride: 104 mmol/L (ref 98–111)
Creatinine, Ser: 0.61 mg/dL (ref 0.44–1.00)
GFR calc Af Amer: >60 mL/min (ref >60)
GFR calc non Af Amer: >60 mL/min (ref >60)
Glucose, Bld: 94 mg/dL (ref 70–99)
Potassium: 3.8 mmol/L (ref 3.5–5.1)
Sodium: 139 mmol/L (ref 135–145)
Total Bilirubin: 0.7 mg/dL (ref 0.3–1.2)
Total Protein: 7.3 g/dL (ref 6.5–8.1)

## 2019-09-01 LAB — CBC WITH DIFFERENTIAL/PLATELET
Abs Immature Granulocytes: 0.02 10*3/uL (ref 0.00–0.07)
Basophils Absolute: 0 10*3/uL (ref 0.0–0.1)
Basophils Relative: 1 %
Eosinophils Absolute: 0.1 10*3/uL (ref 0.0–0.5)
Eosinophils Relative: 1 %
HCT: 38.9 % (ref 36.0–46.0)
Hemoglobin: 13.2 g/dL (ref 12.0–15.0)
Immature Granulocytes: 0 %
Lymphocytes Relative: 35 %
Lymphs Abs: 1.9 10*3/uL (ref 0.7–4.0)
MCH: 31.1 pg (ref 26.0–34.0)
MCHC: 33.9 g/dL (ref 30.0–36.0)
MCV: 91.5 fL (ref 80.0–100.0)
Monocytes Absolute: 0.3 10*3/uL (ref 0.1–1.0)
Monocytes Relative: 5 %
Neutro Abs: 3.1 10*3/uL (ref 1.7–7.7)
Neutrophils Relative %: 58 %
Platelets: 246 10*3/uL (ref 150–400)
RBC: 4.25 MIL/uL (ref 3.87–5.11)
RDW: 12.9 % (ref 11.5–15.5)
WBC: 5.4 10*3/uL (ref 4.0–10.5)
nRBC: 0 % (ref 0.0–0.2)

## 2019-09-01 LAB — URINALYSIS, ROUTINE W REFLEX MICROSCOPIC
Bilirubin Urine: NEGATIVE
Glucose, UA: NEGATIVE mg/dL
Hgb urine dipstick: NEGATIVE
Ketones, ur: NEGATIVE mg/dL
Leukocytes,Ua: NEGATIVE
Nitrite: NEGATIVE
Protein, ur: NEGATIVE mg/dL
Specific Gravity, Urine: 1.025 (ref 1.005–1.030)
pH: 6.5 (ref 5.0–8.0)

## 2019-09-01 LAB — LIPID PANEL
Cholesterol: 189 mg/dL (ref 0–200)
HDL: 69 mg/dL (ref >40)
LDL Cholesterol: 104 mg/dL — ABNORMAL HIGH (ref 0–99)
Total CHOL/HDL Ratio: 2.7 RATIO
Triglycerides: 80 mg/dL (ref <150)
VLDL: 16 mg/dL (ref 0–40)

## 2019-09-01 LAB — POCT URINALYSIS DIPSTICK
Bilirubin, UA: NEGATIVE
Glucose, UA: NEGATIVE
Ketones, UA: NEGATIVE
Leukocytes, UA: NEGATIVE
Nitrite, UA: NEGATIVE
Protein, UA: NEGATIVE
Spec Grav, UA: 1.01 (ref 1.010–1.025)
Urobilinogen, UA: 0.2 E.U./dL
pH, UA: 5 (ref 5.0–8.0)

## 2019-09-01 LAB — TSH: TSH: 1.507 u[IU]/mL (ref 0.350–4.500)

## 2019-09-01 MED ORDER — CHOLESTYRAMINE LIGHT 4 G PO PACK: 4.0000 g | PACK | Freq: Two times a day (BID) | ORAL | 5 refills | Status: DC

## 2019-09-01 NOTE — Progress Notes (Signed)
Date:  09/01/2019   Name:  Becky Clarke   DOB:  May 31, 1976   MRN:  [id number]   Chief Complaint: Annual Exam (Breast Exam.) Becky Clarke is a 43 y.o. female who presents today for her Complete Annual Exam. She feels well. She reports exercising working at [employer]. She reports she is sleeping fairly well. She denies breast problems.  Mammogram  03/2018 Pap 2018 neg with HPV -  Diarrhea  This is a recurrent problem. The current episode started more than 1 month ago. The problem occurs 2 to 4 times per day. The problem has been unchanged. The stool consistency is described as watery. The patient states that diarrhea does not awaken her from sleep. Pertinent negatives include no abdominal pain, arthralgias, chills, coughing, fever, headaches, vomiting or weight loss. Exacerbated by: and time she eats. cholecystectomy    Review of Systems  Constitutional: Negative for chills, fatigue, fever and weight loss.  HENT: Negative for congestion, hearing loss, tinnitus, trouble swallowing and voice change.   Eyes: Negative for visual disturbance.  Respiratory: Negative for cough, chest tightness, shortness of breath and wheezing.   Cardiovascular: Negative for chest pain, palpitations and leg swelling.  Gastrointestinal: Positive for diarrhea (loose watery stools after most meals). Negative for abdominal pain, constipation and vomiting.  Endocrine: Negative for polydipsia and polyuria.  Genitourinary: Negative for dysuria, frequency, genital sores, vaginal bleeding and vaginal discharge.  Musculoskeletal: Negative for arthralgias, gait problem and joint swelling.  Skin: Negative for color change and rash.  Allergic/Immunologic: Positive for environmental allergies.  Neurological: Negative for dizziness, tremors, light-headedness and headaches.  Hematological: Negative for adenopathy. Does not bruise/bleed easily.  Psychiatric/Behavioral: Positive for sleep disturbance. Negative for  dysphoric mood. The patient is not nervous/anxious.     Patient Active Problem List   Diagnosis Date Noted  . Menopausal flushing 03/02/2018  . Insomnia disorder related to known organic factor 03/02/2018  . History of basal cell cancer 03/11/2017    No Known Allergies  Past Surgical History:  Procedure Laterality Date  . CHOLECYSTECTOMY, LAPAROSCOPIC  2014   gall bladder removed  . MOHS SURGERY  2016   on Rt ear  . SHOULDER SURGERY Right 1994 & 2014    Social History   Tobacco Use  . Smoking status: Never Smoker  . Smokeless tobacco: Never Used  Substance Use Topics  . Alcohol use: Yes    Comment: occasional  . Drug use: No     Medication list has been reviewed and updated.  Current Meds  Medication Sig  . cetirizine (ZYRTEC) 10 MG tablet Take 10 mg by mouth daily.  . temazepam (RESTORIL) 15 MG capsule Take 1 capsule (15 mg total) by mouth at bedtime as needed for sleep.    PHQ 2/9 Scores 09/01/2019 04/22/2018 03/25/2017  PHQ - 2 Score 0 0 0    BP Readings from Last 3 Encounters:  09/01/19 116/82  04/22/18 112/78  03/02/18 120/78    Physical Exam Vitals signs and nursing note reviewed.  Constitutional:      General: She is not in acute distress.    Appearance: She is well-developed.  HENT:     Head: Normocephalic and atraumatic.     Right Ear: Tympanic membrane and ear canal normal.     Left Ear: Tympanic membrane and ear canal normal.     Nose:     Right Sinus: No maxillary sinus tenderness.     Left Sinus: No maxillary sinus tenderness.  Eyes:  General: No scleral icterus.       Right eye: No discharge.        Left eye: No discharge.     Conjunctiva/sclera: Conjunctivae normal.  Neck:     Musculoskeletal: Normal range of motion. No erythema.     Thyroid: No thyromegaly.     Vascular: No carotid bruit.  Cardiovascular:     Rate and Rhythm: Normal rate and regular rhythm.     Pulses: Normal pulses.     Heart sounds: Normal heart sounds.   Pulmonary:     Effort: Pulmonary effort is normal. No respiratory distress.     Breath sounds: No wheezing.  Chest:     Breasts:        Right: No mass, nipple discharge, skin change or tenderness.        Left: No mass, nipple discharge, skin change or tenderness.  Abdominal:     General: Bowel sounds are normal. There is no distension.     Palpations: Abdomen is soft. There is no mass.     Tenderness: There is no abdominal tenderness.     Hernia: No hernia is present.  Musculoskeletal: Normal range of motion.     Right lower leg: No edema.     Left lower leg: No edema.  Lymphadenopathy:     Cervical: No cervical adenopathy.  Skin:    General: Skin is warm and dry.     Capillary Refill: Capillary refill takes less than 2 seconds.     Findings: No rash.  Neurological:     General: No focal deficit present.     Mental Status: She is alert and oriented to person, place, and time.     Cranial Nerves: No cranial nerve deficit.     Sensory: No sensory deficit.     Deep Tendon Reflexes: Reflexes are normal and symmetric.  Psychiatric:        Speech: Speech normal.        Behavior: Behavior normal.        Thought Content: Thought content normal.     Wt Readings from Last 3 Encounters:  09/01/19 216 lb (98 kg)  04/22/18 210 lb (95.3 kg)  03/02/18 210 lb 12.8 oz (95.6 kg)    BP 116/82   Pulse 66   Ht 5\' 6"  (1.676 m)   Wt 216 lb (98 kg)   LMP 06/04/2019 (Exact Date) Comment: every 2-3 months. no longer regular (09/01/2019)  SpO2 100%   BMI 34.86 kg/m   Assessment and Plan: 1. Annual physical exam Normal exam except for weight Continue to work on diet and exercise - Lipid panel - POCT urinalysis dipstick  2. Encounter for screening mammogram for breast cancer - MM 3D SCREEN BREAST BILATERAL; Future  3. Insomnia disorder related to known organic factor Continue temazepam PRN  4. Functional diarrhea Likely related to post cholecystectomy - cholestyramine light  (PREVALITE) 4 g packet; Take 1 packet (4 g total) by mouth 2 (two) times daily.  Dispense: 60 packet; Refill: 5 - CBC with Differential/Platelet - Comprehensive metabolic panel - TSH   Partially dictated using Editor, commissioning. Any errors are unintentional.  [name], MD [facility]  09/01/2019

## 2019-11-09 ENCOUNTER — Other Ambulatory Visit: Payer: Self-pay | Admitting: Internal Medicine

## 2019-11-09 DIAGNOSIS — G4700 Insomnia, unspecified: Secondary | ICD-10-CM

## 2019-11-16 ENCOUNTER — Ambulatory Visit (INDEPENDENT_AMBULATORY_CARE_PROVIDER_SITE_OTHER): Payer: 59 | Admitting: Internal Medicine

## 2019-11-16 ENCOUNTER — Encounter: Payer: Self-pay | Admitting: Internal Medicine

## 2019-11-16 ENCOUNTER — Other Ambulatory Visit: Payer: Self-pay

## 2019-11-16 VITALS — Ht 66.0 in | Wt 205.0 lb

## 2019-11-16 DIAGNOSIS — N3000 Acute cystitis without hematuria: Secondary | ICD-10-CM | POA: Diagnosis not present

## 2019-11-16 MED ORDER — CIPROFLOXACIN HCL 250 MG PO TABS: 250.0000 mg | ORAL_TABLET | Freq: Two times a day (BID) | ORAL | 0 refills | Status: AC

## 2019-11-16 NOTE — Progress Notes (Signed)
Date:  11/16/2019   Name:  Becky Clarke   DOB:  Feb 22, 1976   MRN:  [id number]  I connected with this patient, Becky Clarke, by telephone at the patient's home.  I verified that I am speaking with the correct person using two identifiers. This visit was conducted via telephone due to the Covid-19 outbreak from my office at [facility] in [place], [place]. I discussed the limitations, risks, security and privacy concerns of performing an evaluation and management service by telephone. I also discussed with the patient that there may be a patient responsible charge related to this service. The patient expressed understanding and agreed to proceed.  Chief Complaint: Urinary Tract Infection (Lastnight started with lower back pain, urgency to urinate. Burning when urination- slight. Took some AZO and drinnking cranberry juice. )  Urinary Tract Infection  This is a new problem. The current episode started yesterday. The problem occurs every urination. The problem has been unchanged. The quality of the pain is described as burning. The pain is at a severity of 2/10. The pain is mild. There has been no fever. Associated symptoms include frequency and urgency. Pertinent negatives include no chills, flank pain, hematuria or nausea. She has tried increased fluids (azo started today) for the symptoms. last UTI was 18 mo ago    Lab Results  Component Value Date   CREATININE 0.61 09/01/2019   BUN 9 09/01/2019   NA 139 09/01/2019   K 3.8 09/01/2019   CL 104 09/01/2019   CO2 26 09/01/2019   Lab Results  Component Value Date   CHOL 189 09/01/2019   HDL 69 09/01/2019   LDLCALC 104 (H) 09/01/2019   TRIG 80 09/01/2019   CHOLHDL 2.7 09/01/2019   Lab Results  Component Value Date   TSH 1.507 09/01/2019   No results found for: HGBA1C   Review of Systems  Constitutional: Negative for chills, fatigue and fever.  Respiratory: Negative for chest tightness, shortness of breath and  wheezing.   Gastrointestinal: Negative for nausea.  Genitourinary: Positive for frequency and urgency. Negative for flank pain and hematuria.  Neurological: Negative for dizziness and headaches.    Patient Active Problem List   Diagnosis Date Noted  . Functional diarrhea 09/01/2019  . Menopausal flushing 03/02/2018  . Insomnia disorder related to known organic factor 03/02/2018  . History of basal cell cancer 03/11/2017    No Known Allergies  Past Surgical History:  Procedure Laterality Date  . CHOLECYSTECTOMY, LAPAROSCOPIC  2014   gall bladder removed  . MOHS SURGERY  2016   on Rt ear  . SHOULDER SURGERY Right 1994 & 2014    Social History   Tobacco Use  . Smoking status: Never Smoker  . Smokeless tobacco: Never Used  Substance Use Topics  . Alcohol use: Yes    Comment: occasional  . Drug use: No     Medication list has been reviewed and updated.  Current Meds  Medication Sig  . cetirizine (ZYRTEC) 10 MG tablet Take 10 mg by mouth daily.  . cholestyramine light (PREVALITE) 4 g packet Take 1 packet (4 g total) by mouth 2 (two) times daily.  . temazepam (RESTORIL) 15 MG capsule TAKE 1 CAPSULE(15 MG) BY MOUTH AT BEDTIME AS NEEDED FOR SLEEP    PHQ 2/9 Scores 11/16/2019 09/01/2019 04/22/2018 03/25/2017  PHQ - 2 Score 0 0 0 0    BP Readings from Last 3 Encounters:  09/01/19 116/82  04/22/18 112/78  03/02/18 120/78  Physical Exam Pulmonary:     Comments: No cough or dyspnea noted during the call Neurological:     Mental Status: She is alert.  Psychiatric:        Attention and Perception: Attention normal.        Mood and Affect: Mood normal.        Speech: Speech normal.        Cognition and Memory: Cognition normal.     Wt Readings from Last 3 Encounters:  11/16/19 205 lb (93 kg)  09/01/19 216 lb (98 kg)  04/22/18 210 lb (95.3 kg)    Ht 5\' 6"  (1.676 m)   Wt 205 lb (93 kg)   BMI 33.09 kg/m   Assessment and Plan: 1. Acute cystitis without  hematuria Continue to increase fluids/water/cranberry juice May take AZO for three days to reduce bladder discomfort - ciprofloxacin (CIPRO) 250 MG tablet; Take 1 tablet (250 mg total) by mouth 2 (two) times daily for 5 days.  Dispense: 10 tablet; Refill: 0   Partially dictated using Editor, commissioning. Any errors are unintentional.  [name], MD [facility]  11/16/2019

## 2020-01-01 ENCOUNTER — Encounter: Payer: Self-pay | Admitting: Internal Medicine

## 2020-06-25 ENCOUNTER — Other Ambulatory Visit: Payer: Self-pay

## 2020-06-25 ENCOUNTER — Ambulatory Visit
Admission: RE | Admit: 2020-06-25 | Discharge: 2020-06-25 | Disposition: A | Discharge status: Home / Self Care | Payer: 59 | Source: Ambulatory Visit | Attending: Internal Medicine | Admitting: Internal Medicine

## 2020-06-25 DIAGNOSIS — Z1231 Encounter for screening mammogram for malignant neoplasm of breast: Secondary | ICD-10-CM | POA: Diagnosis not present

## 2020-09-05 ENCOUNTER — Ambulatory Visit (INDEPENDENT_AMBULATORY_CARE_PROVIDER_SITE_OTHER): Payer: 59 | Admitting: Internal Medicine

## 2020-09-05 ENCOUNTER — Other Ambulatory Visit: Payer: Self-pay

## 2020-09-05 ENCOUNTER — Encounter: Payer: Self-pay | Admitting: Internal Medicine

## 2020-09-05 VITALS — BP 118/70 | HR 57 | Temp 97.8°F | Ht 66.0 in | Wt 220.0 lb

## 2020-09-05 DIAGNOSIS — Z1159 Encounter for screening for other viral diseases: Secondary | ICD-10-CM

## 2020-09-05 DIAGNOSIS — Z0000 Encounter for general adult medical examination without abnormal findings: Secondary | ICD-10-CM

## 2020-09-05 DIAGNOSIS — G4700 Insomnia, unspecified: Secondary | ICD-10-CM

## 2020-09-05 DIAGNOSIS — Z6835 Body mass index (BMI) 35.0-35.9, adult: Secondary | ICD-10-CM

## 2020-09-05 LAB — POCT URINALYSIS DIPSTICK
Bilirubin, UA: NEGATIVE
Glucose, UA: NEGATIVE
Ketones, UA: NEGATIVE
Leukocytes, UA: NEGATIVE
Nitrite, UA: NEGATIVE
Protein, UA: NEGATIVE
Spec Grav, UA: 1.01 (ref 1.010–1.025)
Urobilinogen, UA: 0.2 E.U./dL
pH, UA: 6 (ref 5.0–8.0)

## 2020-09-05 MED ORDER — TEMAZEPAM 15 MG PO CAPS: ORAL_CAPSULE | ORAL | 5 refills | Status: DC

## 2020-09-05 NOTE — Progress Notes (Signed)
Date:  09/05/2020   Name:  Becky Clarke   DOB:  1976-07-19   MRN:  [id number]   Chief Complaint: Annual Exam (Breast exam. No pap- 2 more years.) Becky Clarke is a 44 y.o. female who presents today for her Complete Annual Exam. She feels fairly well. She reports exercising walking 4-6 mi/day. She reports she is sleeping well with medication. Breast complaints none. She is trying to lose weight using Keto diet but is not seeing much change.  Mammogram: 05/2020 Pap smear: 02/2017 negative with cotesting Colonoscopy: none   There is no immunization history on file for this patient. Patient declines all vaccines.  HPI  Lab Results  Component Value Date   CREATININE 0.61 09/01/2019   BUN 9 09/01/2019   NA 139 09/01/2019   K 3.8 09/01/2019   CL 104 09/01/2019   CO2 26 09/01/2019   Lab Results  Component Value Date   CHOL 189 09/01/2019   HDL 69 09/01/2019   LDLCALC 104 (H) 09/01/2019   TRIG 80 09/01/2019   CHOLHDL 2.7 09/01/2019   Lab Results  Component Value Date   TSH 1.507 09/01/2019   No results found for: HGBA1C Lab Results  Component Value Date   WBC 5.4 09/01/2019   HGB 13.2 09/01/2019   HCT 38.9 09/01/2019   MCV 91.5 09/01/2019   PLT 246 09/01/2019   Lab Results  Component Value Date   ALT 16 09/01/2019   AST 19 09/01/2019   ALKPHOS 59 09/01/2019   BILITOT 0.7 09/01/2019     Review of Systems  Constitutional: Negative for chills, fatigue and fever.  HENT: Negative for congestion, hearing loss, tinnitus, trouble swallowing and voice change.   Eyes: Negative for visual disturbance.  Respiratory: Negative for cough, chest tightness, shortness of breath and wheezing.   Cardiovascular: Negative for chest pain, palpitations and leg swelling.  Gastrointestinal: Negative for abdominal pain, constipation, diarrhea and vomiting.  Endocrine: Negative for polydipsia and polyuria.  Genitourinary: Negative for dysuria, frequency, genital sores, vaginal  bleeding and vaginal discharge.  Musculoskeletal: Negative for arthralgias, gait problem and joint swelling.  Skin: Negative for color change and rash.  Neurological: Negative for dizziness, tremors, light-headedness and headaches.  Hematological: Negative for adenopathy. Does not bruise/bleed easily.  Psychiatric/Behavioral: Negative for dysphoric mood and sleep disturbance. The patient is not nervous/anxious.     Patient Active Problem List   Diagnosis Date Noted  . Functional diarrhea 09/01/2019  . Menopausal flushing 03/02/2018  . Insomnia disorder related to known organic factor 03/02/2018  . History of basal cell cancer 03/11/2017    No Known Allergies  Past Surgical History:  Procedure Laterality Date  . CHOLECYSTECTOMY, LAPAROSCOPIC  2014   gall bladder removed  . MOHS SURGERY  2016   on Rt ear  . SHOULDER SURGERY Right 1994 & 2014    Social History   Tobacco Use  . Smoking status: Never Smoker  . Smokeless tobacco: Never Used  Substance Use Topics  . Alcohol use: Yes    Comment: occasional  . Drug use: No     Medication list has been reviewed and updated.  Current Meds  Medication Sig  . cetirizine (ZYRTEC) 10 MG tablet Take 10 mg by mouth daily.  . temazepam (RESTORIL) 15 MG capsule TAKE 1 CAPSULE(15 MG) BY MOUTH AT BEDTIME AS NEEDED FOR SLEEP    PHQ 2/9 Scores 09/05/2020 11/16/2019 09/01/2019 04/22/2018  PHQ - 2 Score 0 0 0 0  PHQ- 9 Score  0 - - -    GAD 7 : Generalized Anxiety Score 09/05/2020  Nervous, Anxious, on Edge 0  Control/stop worrying 0  Worry too much - different things 0  Trouble relaxing 0  Restless 0  Easily annoyed or irritable 0  Afraid - awful might happen 0  Total GAD 7 Score 0  Anxiety Difficulty Not difficult at all    BP Readings from Last 3 Encounters:  09/05/20 118/70  09/01/19 116/82  04/22/18 112/78    Physical Exam Vitals and nursing note reviewed.  Constitutional:      General: She is not in acute distress.     Appearance: She is well-developed.  HENT:     Head: Normocephalic and atraumatic.     Right Ear: Tympanic membrane and ear canal normal.     Left Ear: Tympanic membrane and ear canal normal.     Nose:     Right Sinus: No maxillary sinus tenderness.     Left Sinus: No maxillary sinus tenderness.  Eyes:     General: No scleral icterus.       Right eye: No discharge.        Left eye: No discharge.     Conjunctiva/sclera: Conjunctivae normal.  Neck:     Thyroid: No thyromegaly.     Vascular: No carotid bruit.  Cardiovascular:     Rate and Rhythm: Normal rate and regular rhythm.     Pulses: Normal pulses.     Heart sounds: Normal heart sounds.  Pulmonary:     Effort: Pulmonary effort is normal. No respiratory distress.     Breath sounds: No wheezing.  Chest:     Breasts:        Right: No mass, nipple discharge, skin change or tenderness.        Left: No mass, nipple discharge, skin change or tenderness.  Abdominal:     General: Bowel sounds are normal.     Palpations: Abdomen is soft.     Tenderness: There is no abdominal tenderness.  Musculoskeletal:     Cervical back: Normal range of motion. No erythema.     Right lower leg: No edema.     Left lower leg: No edema.  Lymphadenopathy:     Cervical: No cervical adenopathy.  Skin:    General: Skin is warm and dry.     Findings: No rash.  Neurological:     Mental Status: She is alert and oriented to person, place, and time.     Cranial Nerves: No cranial nerve deficit.     Sensory: No sensory deficit.     Deep Tendon Reflexes: Reflexes are normal and symmetric.  Psychiatric:        Attention and Perception: Attention normal.        Mood and Affect: Mood normal.     Wt Readings from Last 3 Encounters:  09/05/20 220 lb (99.8 kg)  11/16/19 205 lb (93 kg)  09/01/19 216 lb (98 kg)    BP 118/70   Pulse (!) 57   Temp 97.8 F (36.6 C) (Oral)   Ht 5\' 6"  (1.676 m)   Wt 220 lb (99.8 kg)   LMP 09/05/2020 (Exact Date)    SpO2 100%   BMI 35.51 kg/m   Assessment and Plan: 1. Annual physical exam Exam is normal except for weight.  Pap is up to date. - Lipid panel - CBC with Differential/Platelet - Comprehensive metabolic panel - TSH - POCT urinalysis dipstick  2. Insomnia  disorder related to known organic factor Take medications as needed without side effects - temazepam (RESTORIL) 15 MG capsule; TAKE 1 CAPSULE(15 MG) BY MOUTH AT BEDTIME AS NEEDED FOR SLEEP  Dispense: 30 capsule; Refill: 5  3. Need for hepatitis C screening test - Hepatitis C antibody  4. BMI 35.0-35.9,adult Discussed diet changes and intermittent fasting Continue regular physical activity    Partially dictated using Editor, commissioning. Any errors are unintentional.  [name], MD [facility]  09/05/2020

## 2020-09-06 LAB — COMPREHENSIVE METABOLIC PANEL
ALT: 15 IU/L (ref 0–32)
AST: 15 IU/L (ref 0–40)
Albumin/Globulin Ratio: 1.6 (ref 1.2–2.2)
Albumin: 4.4 g/dL (ref 3.8–4.8)
Alkaline Phosphatase: 79 IU/L (ref 44–121)
BUN/Creatinine Ratio: 15 (ref 9–23)
BUN: 11 mg/dL (ref 6–24)
Bilirubin Total: 0.5 mg/dL (ref 0.0–1.2)
CO2: 23 mmol/L (ref 20–29)
Calcium: 9.3 mg/dL (ref 8.7–10.2)
Chloride: 101 mmol/L (ref 96–106)
Creatinine, Ser: 0.72 mg/dL (ref 0.57–1.00)
GFR calc Af Amer: 116 mL/min/{1.73_m2} (ref >59)
GFR calc non Af Amer: 101 mL/min/{1.73_m2} (ref >59)
Globulin, Total: 2.8 g/dL (ref 1.5–4.5)
Glucose: 82 mg/dL (ref 65–99)
Potassium: 4.5 mmol/L (ref 3.5–5.2)
Sodium: 137 mmol/L (ref 134–144)
Total Protein: 7.2 g/dL (ref 6.0–8.5)

## 2020-09-06 LAB — CBC WITH DIFFERENTIAL/PLATELET
Basophils Absolute: 0.1 10*3/uL (ref 0.0–0.2)
Basos: 1 %
EOS (ABSOLUTE): 0.1 10*3/uL (ref 0.0–0.4)
Eos: 2 %
Hematocrit: 41.7 % (ref 34.0–46.6)
Hemoglobin: 13.8 g/dL (ref 11.1–15.9)
Immature Grans (Abs): 0 10*3/uL (ref 0.0–0.1)
Immature Granulocytes: 0 %
Lymphocytes Absolute: 1.9 10*3/uL (ref 0.7–3.1)
Lymphs: 34 %
MCH: 31.3 pg (ref 26.6–33.0)
MCHC: 33.1 g/dL (ref 31.5–35.7)
MCV: 95 fL (ref 79–97)
Monocytes Absolute: 0.4 10*3/uL (ref 0.1–0.9)
Monocytes: 7 %
Neutrophils Absolute: 3.2 10*3/uL (ref 1.4–7.0)
Neutrophils: 56 %
Platelets: 309 10*3/uL (ref 150–450)
RBC: 4.41 x10E6/uL (ref 3.77–5.28)
RDW: 12 % (ref 11.7–15.4)
WBC: 5.7 10*3/uL (ref 3.4–10.8)

## 2020-09-06 LAB — LIPID PANEL
Chol/HDL Ratio: 2.8 ratio (ref 0.0–4.4)
Cholesterol, Total: 214 mg/dL — ABNORMAL HIGH (ref 100–199)
HDL: 77 mg/dL (ref >39)
LDL Chol Calc (NIH): 118 mg/dL — ABNORMAL HIGH (ref 0–99)
Triglycerides: 109 mg/dL (ref 0–149)
VLDL Cholesterol Cal: 19 mg/dL (ref 5–40)

## 2020-09-06 LAB — HEPATITIS C ANTIBODY: Hep C Virus Ab: <0.1 s/co ratio (ref 0.0–0.9)

## 2020-09-06 LAB — TSH: TSH: 2.01 u[IU]/mL (ref 0.450–4.500)

## 2020-09-10 ENCOUNTER — Other Ambulatory Visit: Payer: Self-pay

## 2020-09-10 ENCOUNTER — Ambulatory Visit (INDEPENDENT_AMBULATORY_CARE_PROVIDER_SITE_OTHER): Payer: 59 | Admitting: Internal Medicine

## 2020-09-10 ENCOUNTER — Encounter: Payer: Self-pay | Admitting: Internal Medicine

## 2020-09-10 VITALS — BP 124/82 | HR 102 | Temp 99.0°F | Ht 66.0 in | Wt 217.0 lb

## 2020-09-10 DIAGNOSIS — J0101 Acute recurrent maxillary sinusitis: Secondary | ICD-10-CM | POA: Diagnosis not present

## 2020-09-10 MED ORDER — AMOXICILLIN 875 MG PO TABS: 875.0000 mg | ORAL_TABLET | Freq: Two times a day (BID) | ORAL | 0 refills | Status: AC

## 2020-09-10 NOTE — Patient Instructions (Addendum)
Resume Flonase nasal spray  Use zyrtec regularly

## 2020-09-10 NOTE — Progress Notes (Signed)
Date:  09/10/2020   Name:  Becky Clarke   DOB:  04/30/1976   MRN:  [id number]   Chief Complaint: Sinusitis (X3 days, chills, headache, sore throat, when bending over feels like her head is going to explode, coughing up green mucous )  Sinusitis This is a new problem. The current episode started in the past 7 days. The problem has been gradually worsening since onset. There has been no fever. The pain is mild. Associated symptoms include chills, coughing, ear pain, headaches, sinus pressure and a sore throat. Pertinent negatives include no shortness of breath. Past treatments include acetaminophen (neti pot flushes, benadryl).    Lab Results  Component Value Date   CREATININE 0.72 09/05/2020   BUN 11 09/05/2020   NA 137 09/05/2020   K 4.5 09/05/2020   CL 101 09/05/2020   CO2 23 09/05/2020   Lab Results  Component Value Date   CHOL 214 (H) 09/05/2020   HDL 77 09/05/2020   LDLCALC 118 (H) 09/05/2020   TRIG 109 09/05/2020   CHOLHDL 2.8 09/05/2020   Lab Results  Component Value Date   TSH 2.010 09/05/2020   No results found for: HGBA1C Lab Results  Component Value Date   WBC 5.7 09/05/2020   HGB 13.8 09/05/2020   HCT 41.7 09/05/2020   MCV 95 09/05/2020   PLT 309 09/05/2020   Lab Results  Component Value Date   ALT 15 09/05/2020   AST 15 09/05/2020   ALKPHOS 79 09/05/2020   BILITOT 0.5 09/05/2020     Review of Systems  Constitutional: Positive for chills. Negative for fever and unexpected weight change.  HENT: Positive for ear pain, sinus pressure, sinus pain and sore throat.   Respiratory: Positive for cough. Negative for shortness of breath and wheezing.   Cardiovascular: Negative for chest pain and palpitations.  Neurological: Positive for headaches. Negative for dizziness and numbness.    Patient Active Problem List   Diagnosis Date Noted  . Functional diarrhea 09/01/2019  . Menopausal flushing 03/02/2018  . Insomnia disorder related to known  organic factor 03/02/2018  . History of basal cell cancer 03/11/2017    No Known Allergies  Past Surgical History:  Procedure Laterality Date  . CHOLECYSTECTOMY, LAPAROSCOPIC  2014   gall bladder removed  . MOHS SURGERY  2016   on Rt ear  . SHOULDER SURGERY Right 1994 & 2014    Social History   Tobacco Use  . Smoking status: Never Smoker  . Smokeless tobacco: Never Used  Substance Use Topics  . Alcohol use: Yes    Comment: occasional  . Drug use: No     Medication list has been reviewed and updated.  Current Meds  Medication Sig  . temazepam (RESTORIL) 15 MG capsule TAKE 1 CAPSULE(15 MG) BY MOUTH AT BEDTIME AS NEEDED FOR SLEEP    PHQ 2/9 Scores 09/10/2020 09/05/2020 11/16/2019 09/01/2019  PHQ - 2 Score 0 0 0 0  PHQ- 9 Score 0 0 - -    GAD 7 : Generalized Anxiety Score 09/10/2020 09/05/2020  Nervous, Anxious, on Edge 0 0  Control/stop worrying 0 0  Worry too much - different things 0 0  Trouble relaxing 0 0  Restless 0 0  Easily annoyed or irritable 0 0  Afraid - awful might happen 0 0  Total GAD 7 Score 0 0  Anxiety Difficulty Not difficult at all Not difficult at all    BP Readings from Last 3 Encounters:  09/10/20  124/82  09/05/20 118/70  09/01/19 116/82    Physical Exam Constitutional:      General: She is not in acute distress.    Appearance: She is well-developed.  HENT:     Right Ear: Ear canal and external ear normal. Tympanic membrane is retracted. Tympanic membrane is not erythematous.     Left Ear: Ear canal and external ear normal. Tympanic membrane is retracted. Tympanic membrane is not erythematous.     Nose:     Right Sinus: Maxillary sinus tenderness and frontal sinus tenderness present.     Left Sinus: Maxillary sinus tenderness and frontal sinus tenderness present.     Mouth/Throat:     Mouth: No oral lesions.     Pharynx: Uvula midline. Posterior oropharyngeal erythema present. No oropharyngeal exudate.  Cardiovascular:     Rate  and Rhythm: Normal rate and regular rhythm.     Pulses: Normal pulses.     Heart sounds: Normal heart sounds.  Pulmonary:     Effort: Pulmonary effort is normal.     Breath sounds: Normal breath sounds. No wheezing or rales.  Musculoskeletal:     Cervical back: Normal range of motion.  Lymphadenopathy:     Cervical: No cervical adenopathy.  Neurological:     Mental Status: She is alert and oriented to person, place, and time.     Wt Readings from Last 3 Encounters:  09/10/20 217 lb (98.4 kg)  09/05/20 220 lb (99.8 kg)  11/16/19 205 lb (93 kg)    BP 124/82 (BP Location: Right Arm, Patient Position: Sitting)   Pulse (!) 102   Temp 99 F (37.2 C) (Oral)   Ht 5\' 6"  (1.676 m)   Wt 217 lb (98.4 kg)   LMP 09/05/2020 (Exact Date)   SpO2 97%   BMI 35.02 kg/m   Assessment and Plan: 1. Acute recurrent maxillary sinusitis Continue neti pot flushes Resume Flonase spray and zyrtec - amoxicillin (AMOXIL) 875 MG tablet; Take 1 tablet (875 mg total) by mouth 2 (two) times daily for 10 days.  Dispense: 20 tablet; Refill: 0   Partially dictated using Editor, commissioning. Any errors are unintentional.  [name], MD [facility]  09/10/2020

## 2020-12-04 ENCOUNTER — Telehealth: Payer: Self-pay

## 2020-12-04 NOTE — Telephone Encounter (Unsigned)
Copied from CRM [id number]. Topic: General - Other >> Dec 04, 2020  8:10 AM Gwenlyn Fudge wrote: Reason for CRM: Pt called stating that she has a cough, sore throat, and headache. She states that she cannot wait until PCP next appt on 12/13/19 and is requesting to be fit in on the schedule. Please advise.

## 2020-12-04 NOTE — Telephone Encounter (Signed)
Spoke to pt made an appt for tomorrow for cough, and sore throat. Pt verbalized understanding. Pt wanted a strep test. Told pt that the doctor will determine if a strep test is necessary at her office visit. Pt verbalized understanding.  KP

## 2020-12-05 ENCOUNTER — Ambulatory Visit (INDEPENDENT_AMBULATORY_CARE_PROVIDER_SITE_OTHER): Payer: 59 | Admitting: Family Medicine

## 2020-12-05 ENCOUNTER — Encounter: Payer: Self-pay | Admitting: Internal Medicine

## 2020-12-05 ENCOUNTER — Other Ambulatory Visit: Payer: Self-pay

## 2020-12-05 ENCOUNTER — Encounter: Payer: Self-pay | Admitting: Family Medicine

## 2020-12-05 VITALS — BP 130/80 | HR 80 | Temp 98.1°F | Ht 66.0 in | Wt 219.0 lb

## 2020-12-05 DIAGNOSIS — R059 Cough, unspecified: Secondary | ICD-10-CM

## 2020-12-05 DIAGNOSIS — J020 Streptococcal pharyngitis: Secondary | ICD-10-CM | POA: Diagnosis not present

## 2020-12-05 LAB — POCT RAPID STREP A (OFFICE): Rapid Strep A Screen: POSITIVE — AB

## 2020-12-05 MED ORDER — GUAIFENESIN-DM 100-10 MG/5ML PO SYRP: 5.0000 mL | ORAL_SOLUTION | ORAL | 0 refills | Status: DC | PRN

## 2020-12-05 MED ORDER — GUAIFENESIN-CODEINE 100-10 MG/5ML PO SYRP: 5.0000 mL | ORAL_SOLUTION | Freq: Four times a day (QID) | ORAL | 0 refills | Status: DC | PRN

## 2020-12-05 MED ORDER — AMOXICILLIN 875 MG PO TABS: 875.0000 mg | ORAL_TABLET | Freq: Two times a day (BID) | ORAL | 0 refills | Status: DC

## 2020-12-05 NOTE — Progress Notes (Signed)
Date:  12/05/2020   Name:  Becky Clarke   DOB:  1976-07-16   MRN:  [id number]   Chief Complaint: Sore Throat (Started Sunday. Cannot swallow- painful. Alternating ibuprofen and tylenol. Has had strep before and feels like she has strep per patient. No fever.)  Sore Throat  This is a new problem. The current episode started in the past 7 days (Sunday). The problem has been gradually worsening. Neither side of throat is experiencing more pain than the other. There has been no fever. The pain is moderate. Associated symptoms include congestion, coughing, ear pain, headaches, a hoarse voice, swollen glands and trouble swallowing. Pertinent negatives include no abdominal pain, diarrhea, drooling, ear discharge, plugged ear sensation, neck pain, shortness of breath, stridor or vomiting. Associated symptoms comments: rhinorrea. She has had exposure to strep. She has had no exposure to mono. She has tried acetaminophen, NSAIDs and gargles (flonase) for the symptoms. The treatment provided mild relief.    Lab Results  Component Value Date   CREATININE 0.72 09/05/2020   BUN 11 09/05/2020   NA 137 09/05/2020   K 4.5 09/05/2020   CL 101 09/05/2020   CO2 23 09/05/2020   Lab Results  Component Value Date   CHOL 214 (H) 09/05/2020   HDL 77 09/05/2020   LDLCALC 118 (H) 09/05/2020   TRIG 109 09/05/2020   CHOLHDL 2.8 09/05/2020   Lab Results  Component Value Date   TSH 2.010 09/05/2020   No results found for: HGBA1C Lab Results  Component Value Date   WBC 5.7 09/05/2020   HGB 13.8 09/05/2020   HCT 41.7 09/05/2020   MCV 95 09/05/2020   PLT 309 09/05/2020   Lab Results  Component Value Date   ALT 15 09/05/2020   AST 15 09/05/2020   ALKPHOS 79 09/05/2020   BILITOT 0.5 09/05/2020     Review of Systems  Constitutional: Negative.  Negative for chills, fatigue, fever and unexpected weight change.  HENT: Positive for congestion, ear pain, hoarse voice and trouble swallowing. Negative  for drooling, ear discharge, rhinorrhea, sinus pressure, sneezing and sore throat.   Eyes: Negative for double vision, photophobia, pain, discharge, redness and itching.  Respiratory: Positive for cough. Negative for shortness of breath, wheezing and stridor.   Gastrointestinal: Negative for abdominal pain, blood in stool, constipation, diarrhea, nausea and vomiting.  Endocrine: Negative for cold intolerance, heat intolerance, polydipsia, polyphagia and polyuria.  Genitourinary: Negative for dysuria, flank pain, frequency, hematuria, menstrual problem, pelvic pain, urgency, vaginal bleeding and vaginal discharge.  Musculoskeletal: Negative for arthralgias, back pain, myalgias and neck pain.  Skin: Negative for rash.  Allergic/Immunologic: Negative for environmental allergies and food allergies.  Neurological: Positive for headaches. Negative for dizziness, weakness, light-headedness and numbness.  Hematological: Negative for adenopathy. Does not bruise/bleed easily.  Psychiatric/Behavioral: Negative for dysphoric mood. The patient is not nervous/anxious.     Patient Active Problem List   Diagnosis Date Noted  . Functional diarrhea 09/01/2019  . Menopausal flushing 03/02/2018  . Insomnia disorder related to known organic factor 03/02/2018  . History of basal cell cancer 03/11/2017    No Known Allergies  Past Surgical History:  Procedure Laterality Date  . CHOLECYSTECTOMY, LAPAROSCOPIC  2014   gall bladder removed  . MOHS SURGERY  2016   on Rt ear  . SHOULDER SURGERY Right 1994 & 2014    Social History   Tobacco Use  . Smoking status: Never Smoker  . Smokeless tobacco: Never Used  Substance  Use Topics  . Alcohol use: Yes    Comment: occasional  . Drug use: No     Medication list has been reviewed and updated.  Current Meds  Medication Sig  . cetirizine (ZYRTEC) 10 MG tablet Take 10 mg by mouth daily.  . temazepam (RESTORIL) 15 MG capsule TAKE 1 CAPSULE(15 MG) BY MOUTH  AT BEDTIME AS NEEDED FOR SLEEP    PHQ 2/9 Scores 12/05/2020 09/10/2020 09/05/2020 11/16/2019  PHQ - 2 Score 0 0 0 0  PHQ- 9 Score 0 0 0 -    GAD 7 : Generalized Anxiety Score 12/05/2020 09/10/2020 09/05/2020  Nervous, Anxious, on Edge 0 0 0  Control/stop worrying 0 0 0  Worry too much - different things 0 0 0  Trouble relaxing 0 0 0  Restless 0 0 0  Easily annoyed or irritable 0 0 0  Afraid - awful might happen 0 0 0  Total GAD 7 Score 0 0 0  Anxiety Difficulty Not difficult at all Not difficult at all Not difficult at all    BP Readings from Last 3 Encounters:  12/05/20 130/80  09/10/20 124/82  09/05/20 118/70    Physical Exam Vitals and nursing note reviewed.  Constitutional:      General: She is not in acute distress.    Appearance: She is not diaphoretic.  HENT:     Head: Normocephalic and atraumatic.     Right Ear: Tympanic membrane, ear canal and external ear normal. No drainage.     Left Ear: Tympanic membrane, ear canal and external ear normal. No drainage.     Nose: Congestion and rhinorrhea present.     Right Turbinates: Enlarged.     Left Turbinates: Enlarged.     Right Sinus: No maxillary sinus tenderness or frontal sinus tenderness.     Left Sinus: No maxillary sinus tenderness or frontal sinus tenderness.     Mouth/Throat:     Lips: Pink.     Pharynx: Pharyngeal swelling and posterior oropharyngeal erythema present.     Tonsils: No tonsillar exudate.  Eyes:     General:        Right eye: No discharge.        Left eye: No discharge.     Extraocular Movements: EOM normal.     Conjunctiva/sclera: Conjunctivae normal.     Pupils: Pupils are equal, round, and reactive to light.  Neck:     Thyroid: No thyromegaly.     Vascular: No JVD.  Cardiovascular:     Rate and Rhythm: Normal rate and regular rhythm.     Pulses: Intact distal pulses.     Heart sounds: Normal heart sounds. No murmur heard. No friction rub. No gallop.   Pulmonary:     Effort: Pulmonary  effort is normal.     Breath sounds: Normal breath sounds. No decreased breath sounds, wheezing, rhonchi or rales.  Abdominal:     General: Bowel sounds are normal.     Palpations: Abdomen is soft. There is no hepatomegaly, splenomegaly or mass.     Tenderness: There is no abdominal tenderness. There is no guarding or rebound.  Musculoskeletal:        General: No edema. Normal range of motion.     Cervical back: Normal range of motion and neck supple.  Lymphadenopathy:     Head:     Right side of head: Submandibular and tonsillar adenopathy present.     Left side of head: Submandibular and tonsillar adenopathy  present.     Cervical: No cervical adenopathy.     Comments: tender  Skin:    General: Skin is warm and dry.  Neurological:     Mental Status: She is alert.     Deep Tendon Reflexes: Reflexes are normal and symmetric.     Wt Readings from Last 3 Encounters:  12/05/20 219 lb (99.3 kg)  09/10/20 217 lb (98.4 kg)  09/05/20 220 lb (99.8 kg)    BP 130/80   Pulse 80   Temp 98.1 F (36.7 C) (Oral)   Ht 5\' 6"  (1.676 m)   Wt 219 lb (99.3 kg)   SpO2 98%   BMI 35.35 kg/m   Assessment and Plan:   1. Strep pharyngitis New onset.  Persistent.  Symptomatic with sore throat and cough.  Strep test was done and noted to be positive.  We will initiate amoxicillin 875 mg twice a day for 10 days seen that she has had a productive nasal drainage that may suggest an upper respiratory infection such as sinusitis. - amoxicillin (AMOXIL) 875 MG tablet; Take 1 tablet (875 mg total) by mouth 2 (two) times daily.  Dispense: 20 tablet; Refill: 0 - POCT rapid strep A  2. Cough New onset persistent.  Patient is having difficulty with sleep at night secondary to cough.  We will initiate Robitussin-AC 1 teaspoon every 6 hours as needed cough.

## 2021-05-14 ENCOUNTER — Other Ambulatory Visit: Payer: Self-pay | Admitting: Internal Medicine

## 2021-05-14 DIAGNOSIS — G4700 Insomnia, unspecified: Secondary | ICD-10-CM

## 2021-05-14 NOTE — Telephone Encounter (Signed)
Requested medications are due for refill today.  yes  Requested medications are on the active medications list.  yes  Last refill. 09/05/2020  Future visit scheduled.   yes  Notes to clinic.  Medication not delegated.

## 2021-05-15 ENCOUNTER — Telehealth: Payer: Self-pay | Admitting: Internal Medicine

## 2021-05-15 ENCOUNTER — Ambulatory Visit (INDEPENDENT_AMBULATORY_CARE_PROVIDER_SITE_OTHER): Payer: 59 | Admitting: Family Medicine

## 2021-05-15 ENCOUNTER — Other Ambulatory Visit: Payer: Self-pay | Admitting: Internal Medicine

## 2021-05-15 ENCOUNTER — Encounter: Payer: Self-pay | Admitting: Family Medicine

## 2021-05-15 ENCOUNTER — Other Ambulatory Visit: Payer: Self-pay

## 2021-05-15 VITALS — BP 118/86 | HR 87 | Temp 98.0°F | Ht 66.0 in | Wt 227.0 lb

## 2021-05-15 DIAGNOSIS — R399 Unspecified symptoms and signs involving the genitourinary system: Secondary | ICD-10-CM | POA: Diagnosis not present

## 2021-05-15 DIAGNOSIS — N3091 Cystitis, unspecified with hematuria: Secondary | ICD-10-CM

## 2021-05-15 DIAGNOSIS — R8290 Unspecified abnormal findings in urine: Secondary | ICD-10-CM | POA: Diagnosis not present

## 2021-05-15 DIAGNOSIS — R0781 Pleurodynia: Secondary | ICD-10-CM

## 2021-05-15 LAB — POCT URINALYSIS DIPSTICK
Bilirubin, UA: NEGATIVE
Glucose, UA: NEGATIVE
Ketones, UA: NEGATIVE
Nitrite, UA: NEGATIVE
Protein, UA: NEGATIVE
Spec Grav, UA: <=1.005 — AB (ref 1.010–1.025)
Urobilinogen, UA: 0.2 E.U./dL
pH, UA: 5 (ref 5.0–8.0)

## 2021-05-15 MED ORDER — NITROFURANTOIN MONOHYD MACRO 100 MG PO CAPS: 100.0000 mg | ORAL_CAPSULE | Freq: Two times a day (BID) | ORAL | 0 refills | Status: AC

## 2021-05-15 NOTE — Assessment & Plan Note (Signed)
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  Denies any shortness of air, no additional chest pain.  She did seek evaluation at outside urgent care where x-rays were obtained and, per patient, no fractures are visualized.  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  Her physical exam today reveals tenderness along the inferior right costal border, no paradoxical chest movement noted, cardiopulmonary findings are benign, no crepitus appreciated.  Have advised patient to splint the area with trial of Ace wrap for gentle compression throughout the day, removal of Ace wrap at night and trial of pillow to torso.  Topical medication such as OTC lidocaine-menthol formulations and diclofenac 1% advised as well.  I did direct her to contact her office in 4 to 6 weeks for any suboptimal progress, additional imaging and escalation of pharmacotherapy can be considered at that time.

## 2021-05-15 NOTE — Assessment & Plan Note (Signed)
Patient with at least 1 day history of suprapubic pressure, tenderness, frequency.  Patient has been trying to increase her free water intake due to the summer heat and initially attributed her symptoms to increased hydration however noted persistent frequency yesterday despite limited free water intake.  She denies any overt flank tenderness but has been experiencing right anterolateral pain which she attributes to trauma.  Urinalysis obtained today is positive for leukocytes and blood.  Physical exam reveals suprapubic tenderness, normoactive bowel sounds, tenderness in the right upper quadrant at the costal border, no organomegaly.  She is nontender with percussion at the CVA bilaterally.  Given her constellation of findings I will place her on a course of Macrobid twice daily for 7 days, I have encouraged supportive care as well, and the urine was sent for culture.  I have also advised her to monitor for any signs of yeast infection, Diflucan can be sent in if noted.  We can contact her if medication change needed.

## 2021-05-15 NOTE — Telephone Encounter (Signed)
Pt called to be seen today for a UTI/ pt stated she gets them every year/ pt asked if Dr Army Melia can send her medication for it/ pt stated she will not be able to work today with the feeling from the UTI / please advise/ Pt refused appt for tomorrow morning

## 2021-05-15 NOTE — Telephone Encounter (Signed)
Please review. Last office visit 12/05/2020.  KP

## 2021-05-15 NOTE — Patient Instructions (Signed)
-   Dose Macrobid twice daily for 7 days - Drink plenty of free water - Can consider probiotic (yogurt, OTC supplement) - We will contact you if treatment change needs to take place  For ribs: - Consider "splinting "the ribs with Ace wrap, pillow at night - Can trial over-the-counter Salonpas with lidocaine on an as-needed basis - Can also trial over-the-counter Voltaren gel (diclofenac 1%) up to 4 times a day - Contact our office in 4-6 weeks for any lingering symptoms or for any questions

## 2021-05-15 NOTE — Progress Notes (Signed)
Primary Care / Sports Medicine Office Visit  Patient Information:  Patient ID: Becky Clarke, female DOB: 13-Oct-1976 Age: 45 y.o. MRN: [id number]   Becky Clarke is a pleasant 45 y.o. 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  She did seek care at outside urgent care where x-rays were obtained and, per patient, negative for fracture.  She continues to be symptomatic, currently treating with horse liniment as needed.    Review of Systems pertinent details above   Patient Active Problem List   Diagnosis Date Noted   Rib pain on right side 05/15/2021   Hemorrhagic cystitis 05/15/2021   Functional diarrhea 09/01/2019   Menopausal flushing 03/02/2018   Insomnia disorder related to known organic factor 03/02/2018   History of basal cell cancer 03/11/2017   Past Medical History:  Diagnosis Date   Allergy    seaosonal   Basal cell carcinoma 02/2018   Upper leg on left   Cancer ([place]) 07/2015   basal cell ca in the ear   Elbow strain, right, subsequent encounter 03/25/2017   Outpatient Encounter Medications as of 05/15/2021  Medication Sig   ibuprofen (ADVIL) 200 MG tablet Take 800 mg by mouth every 8 (eight) hours as needed.   melatonin 5 MG TABS Take 5 mg by mouth at bedtime as needed.   nitrofurantoin, macrocrystal-monohydrate, (MACROBID) 100 MG capsule Take 1 capsule (100 mg total) by mouth 2 (two) times daily.   Phenazopyridine HCl (AZO URINARY PAIN RELIEF PO) Take 2 tablets by mouth daily as needed.   temazepam (RESTORIL) 15 MG capsule TAKE 1 CAPSULE(15 MG) BY MOUTH AT BEDTIME AS NEEDED FOR SLEEP   [DISCONTINUED] cetirizine (ZYRTEC) 10 MG tablet Take 10 mg by  mouth daily.   [DISCONTINUED] cyclobenzaprine (FLEXERIL) 10 MG tablet Take 1 tablet by mouth 3 (three) times daily as needed.   [DISCONTINUED] traMADol (ULTRAM) 50 MG tablet Take 50 mg by mouth every 8 (eight) hours as needed.   No facility-administered encounter medications on file as of 05/15/2021.   Past Surgical History:  Procedure Laterality Date   CHOLECYSTECTOMY, LAPAROSCOPIC  2014   gall bladder removed   MOHS SURGERY  2016   on Rt ear   SHOULDER SURGERY Right 1994 & 2014    Vitals:   05/15/21 1431  BP: 118/86  Pulse: 87  Temp: 98 F (36.7 C)  SpO2: 98%   Vitals:   05/15/21 1431  Weight: 227 lb (103 kg)  Height: 5\' 6"  (1.676 m)   Body mass index is 36.64 kg/m.  No results found.   Independent interpretation of notes and tests performed by another provider:   None  Procedures performed:   None  Pertinent History, Exam, Impression, and Recommendations:   Hemorrhagic cystitis Patient with at least 1 day history of suprapubic pressure, tenderness, frequency.  Patient has been trying to increase her free water intake due to the summer heat and initially attributed her symptoms to increased hydration however noted persistent frequency yesterday despite limited free water intake.  She denies any overt flank tenderness but has been experiencing right anterolateral pain which she attributes to trauma.  Urinalysis obtained today is  positive for leukocytes and blood.  Physical exam reveals suprapubic tenderness, normoactive bowel sounds, tenderness in the right upper quadrant at the costal border, no organomegaly.  She is nontender with percussion at the CVA bilaterally.  Given her constellation of findings I will place her on a course of Macrobid twice daily for 7 days, I have encouraged supportive care as well, and the urine was sent for culture.  I have also advised her to monitor for any signs of yeast infection, Diflucan can be sent in if noted.  We can contact her if  medication change needed.  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  Denies any shortness of air, no additional chest pain.  She did seek evaluation at outside urgent care where x-rays were obtained and, per patient, no fractures are visualized.  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  Her physical exam today reveals tenderness along the inferior right costal border, no paradoxical chest movement noted, cardiopulmonary findings are benign, no crepitus appreciated.  Have advised patient to splint the area with trial of Ace wrap for gentle compression throughout the day, removal of Ace wrap at night and trial of pillow to torso.  Topical medication such as OTC lidocaine-menthol formulations and diclofenac 1% advised as well.  I did direct her to contact her office in 4 to 6 weeks for any suboptimal progress, additional imaging and escalation of pharmacotherapy can be considered at that time.   Orders & Medications Meds ordered this encounter  Medications   nitrofurantoin, macrocrystal-monohydrate, (MACROBID) 100 MG capsule    Sig: Take 1 capsule (100 mg total) by mouth 2 (two) times daily.    Dispense:  14 capsule    Refill:  0   Orders Placed This Encounter  Procedures   Urine Culture   POCT Urinalysis Dipstick     Return if symptoms worsen or fail to improve.     Montel Culver, MD   Primary Care Sports Medicine [place]

## 2021-05-20 LAB — URINE CULTURE

## 2021-05-20 LAB — SPECIMEN STATUS REPORT

## 2021-06-16 ENCOUNTER — Other Ambulatory Visit: Payer: Self-pay | Admitting: Internal Medicine

## 2021-06-16 DIAGNOSIS — Z1231 Encounter for screening mammogram for malignant neoplasm of breast: Secondary | ICD-10-CM

## 2021-07-16 ENCOUNTER — Other Ambulatory Visit: Payer: Self-pay | Admitting: Internal Medicine

## 2021-07-16 DIAGNOSIS — G4700 Insomnia, unspecified: Secondary | ICD-10-CM

## 2021-07-16 NOTE — Telephone Encounter (Signed)
Please review. Last office visit 05/15/21.  KP

## 2021-07-16 NOTE — Telephone Encounter (Signed)
Requested medication (s) are due for refill today: Yes  Requested medication (s) are on the active medication list: Yes  Last refill:  05/15/21  Future visit scheduled: Yes  Notes to clinic:  See request.    Requested Prescriptions  Pending Prescriptions Disp Refills   temazepam (RESTORIL) 15 MG capsule [Pharmacy Med Name: TEMAZEPAM '15MG'$  CAPSULES] 30 capsule     Sig: TAKE 1 CAPSULE(15 MG) BY MOUTH AT BEDTIME AS NEEDED FOR SLEEP     Not Delegated - Psychiatry:  Anxiolytics/Hypnotics Failed - 07/16/2021  7:45 AM      Failed - This refill cannot be delegated      Failed - Urine Drug Screen completed in last 360 days      Passed - Valid encounter within last 6 months    Recent Outpatient Visits           2 months ago Hemorrhagic cystitis   [facility] Montel Culver, MD   7 months ago Strep pharyngitis   [facility] Juline Patch, MD   10 months ago Acute recurrent maxillary sinusitis   [facility] Glean Hess, MD   10 months ago Annual physical exam   [facility] Glean Hess, MD   1 year ago Acute cystitis without hematuria   [facility] Glean Hess, MD       Future Appointments             In 1 month Army Melia Jesse Sans, MD [facility], [facility]

## 2021-09-08 ENCOUNTER — Encounter: Payer: 59 | Admitting: Internal Medicine

## 2021-10-15 ENCOUNTER — Other Ambulatory Visit: Payer: Self-pay | Admitting: Family Medicine

## 2021-10-15 DIAGNOSIS — Z1231 Encounter for screening mammogram for malignant neoplasm of breast: Secondary | ICD-10-CM

## 2021-10-16 ENCOUNTER — Other Ambulatory Visit: Payer: Self-pay | Admitting: Family Medicine

## 2021-10-16 DIAGNOSIS — G43809 Other migraine, not intractable, without status migrainosus: Secondary | ICD-10-CM

## 2021-11-05 ENCOUNTER — Other Ambulatory Visit: Payer: 59

## 2021-12-03 ENCOUNTER — Ambulatory Visit
Admission: RE | Admit: 2021-12-03 | Discharge: 2021-12-03 | Disposition: A | Discharge status: Home / Self Care | Payer: Self-pay | Source: Ambulatory Visit | Attending: Family Medicine | Admitting: Family Medicine

## 2021-12-03 DIAGNOSIS — G43809 Other migraine, not intractable, without status migrainosus: Secondary | ICD-10-CM

## 2021-12-03 MED ORDER — GADOBENATE DIMEGLUMINE 529 MG/ML IV SOLN
20.0000 mL | Freq: Once | INTRAVENOUS | Status: AC | PRN
  Administered 2021-12-03: 20 mL via INTRAVENOUS

## 2022-04-20 ENCOUNTER — Ambulatory Visit: Payer: Managed Care, Other (non HMO)

## 2022-05-06 ENCOUNTER — Ambulatory Visit
Admission: RE | Admit: 2022-05-06 | Discharge: 2022-05-06 | Disposition: A | Discharge status: Home / Self Care | Payer: Managed Care, Other (non HMO) | Source: Ambulatory Visit | Attending: Family Medicine | Admitting: Family Medicine

## 2022-05-06 ENCOUNTER — Encounter: Payer: Self-pay | Admitting: Radiology

## 2022-05-06 DIAGNOSIS — Z1231 Encounter for screening mammogram for malignant neoplasm of breast: Secondary | ICD-10-CM | POA: Diagnosis not present

## 2023-05-13 ENCOUNTER — Other Ambulatory Visit: Payer: Self-pay

## 2023-05-13 DIAGNOSIS — Z1231 Encounter for screening mammogram for malignant neoplasm of breast: Secondary | ICD-10-CM

## 2023-06-08 ENCOUNTER — Ambulatory Visit
Admission: RE | Admit: 2023-06-08 | Discharge: 2023-06-08 | Disposition: A | Discharge status: Home / Self Care | Payer: Managed Care, Other (non HMO) | Source: Ambulatory Visit | Attending: Family Medicine | Admitting: Family Medicine

## 2023-06-08 DIAGNOSIS — Z1231 Encounter for screening mammogram for malignant neoplasm of breast: Secondary | ICD-10-CM | POA: Insufficient documentation

## 2023-10-20 ENCOUNTER — Other Ambulatory Visit: Payer: Self-pay | Admitting: Family Medicine

## 2023-10-20 DIAGNOSIS — M5416 Radiculopathy, lumbar region: Secondary | ICD-10-CM

## 2023-12-04 ENCOUNTER — Other Ambulatory Visit: Payer: Managed Care, Other (non HMO)

## 2023-12-07 ENCOUNTER — Ambulatory Visit
Admission: RE | Admit: 2023-12-07 | Discharge: 2023-12-07 | Disposition: A | Discharge status: Home / Self Care | Payer: Managed Care, Other (non HMO) | Source: Ambulatory Visit | Attending: Family Medicine | Admitting: Family Medicine

## 2023-12-07 DIAGNOSIS — M5416 Radiculopathy, lumbar region: Secondary | ICD-10-CM

## 2024-05-03 ENCOUNTER — Other Ambulatory Visit: Payer: Self-pay

## 2024-05-03 ENCOUNTER — Emergency Department
Admission: EM | Admit: 2024-05-03 | Discharge: 2024-05-03 | Disposition: A | Discharge status: Home / Self Care | Attending: Emergency Medicine | Admitting: Emergency Medicine

## 2024-05-03 ENCOUNTER — Emergency Department

## 2024-05-03 DIAGNOSIS — R079 Chest pain, unspecified: Secondary | ICD-10-CM

## 2024-05-03 DIAGNOSIS — I10 Essential (primary) hypertension: Secondary | ICD-10-CM | POA: Diagnosis not present

## 2024-05-03 DIAGNOSIS — R0789 Other chest pain: Secondary | ICD-10-CM | POA: Insufficient documentation

## 2024-05-03 DIAGNOSIS — R0609 Other forms of dyspnea: Secondary | ICD-10-CM | POA: Diagnosis not present

## 2024-05-03 LAB — CBC
HCT: 40.9 % (ref 36.0–46.0)
Hemoglobin: 14.1 g/dL (ref 12.0–15.0)
MCH: 31.5 pg (ref 26.0–34.0)
MCHC: 34.5 g/dL (ref 30.0–36.0)
MCV: 91.3 fL (ref 80.0–100.0)
Platelets: 226 10*3/uL (ref 150–400)
RBC: 4.48 MIL/uL (ref 3.87–5.11)
RDW: 11.9 % (ref 11.5–15.5)
WBC: 6 10*3/uL (ref 4.0–10.5)
nRBC: 0 % (ref 0.0–0.2)

## 2024-05-03 LAB — POC URINE PREG, ED: Preg Test, Ur: NEGATIVE

## 2024-05-03 LAB — BASIC METABOLIC PANEL WITH GFR
Anion gap: 8 (ref 5–15)
BUN: 15 mg/dL (ref 6–20)
CO2: 24 mmol/L (ref 22–32)
Calcium: 9.4 mg/dL (ref 8.9–10.3)
Chloride: 106 mmol/L (ref 98–111)
Creatinine, Ser: 0.9 mg/dL (ref 0.44–1.00)
GFR, Estimated: >60 mL/min (ref >60)
Glucose, Bld: 99 mg/dL (ref 70–99)
Potassium: 4.6 mmol/L (ref 3.5–5.1)
Sodium: 138 mmol/L (ref 135–145)

## 2024-05-03 LAB — TROPONIN I (HIGH SENSITIVITY): Troponin I (High Sensitivity): 3 ng/L (ref <18)

## 2024-05-03 NOTE — ED Provider Notes (Signed)
 [facility] Provider Note   Event Date/Time   First MD Initiated Contact with Patient 05/03/24 [phone]     (approximate) History  Chest Pain  HPI Becky Clarke is a 48 y.o. female with a past medical history of hypertension and hyperlipidemia who presents complaining of left upper chest wall pain.  Patient states that this pain woke her from sleep this morning approximately 2 hours prior to arrival.  Patient has tried to take aspirin with minimal relief of this pain.  Patient does endorse worsening and shortness of breath with movement as well as trying to climb stairs.  Patient also endorses worsening with palpation.  Patient denies similar symptoms in the past. ROS: Patient currently denies any vision changes, tinnitus, difficulty speaking, facial droop, sore throat, abdominal pain, nausea/vomiting/diarrhea, dysuria, or weakness/numbness/paresthesias in any extremity   Physical Exam  Triage Vital Signs: ED Triage Vitals  Encounter Vitals Group     BP 05/03/24 0819 137/69     Systolic BP Percentile --      Diastolic BP Percentile --      Pulse Rate 05/03/24 0819 81     Resp 05/03/24 0819 17     Temp 05/03/24 0820 98.2 F (36.8 C)     Temp Source 05/03/24 0819 Oral     SpO2 05/03/24 0819 100 %     Weight 05/03/24 0819 227 lb 1.2 oz (103 kg)     Height 05/03/24 0819 5\' 6"  (1.676 m)     Head Circumference --      Peak Flow --      Pain Score 05/03/24 0819 4     Pain Loc --      Pain Education --      Exclude from Growth Chart --    Most recent vital signs: Vitals:   05/03/24 0945 05/03/24 1015  BP:    Pulse: 78 79  Resp: 16 17  Temp:    SpO2: 97% 98%   General: Awake, oriented x4. CV:  Good peripheral perfusion.  No MGR Resp:  Normal effort.  CTAB Abd:  No distention. Other:  Middle-aged obese Caucasian female resting comfortably in no acute distress ED Results / Procedures / Treatments  Labs (all labs ordered are listed, but only abnormal  results are displayed) Labs Reviewed  BASIC METABOLIC PANEL WITH GFR  CBC  COMPREHENSIVE METABOLIC PANEL WITH GFR  POC URINE PREG, ED  TROPONIN I (HIGH SENSITIVITY)  TROPONIN I (HIGH SENSITIVITY)   EKG ED ECG REPORT I, Charleen Conn, the attending physician, personally viewed and interpreted this ECG. Date: 05/03/2024 EKG Time: 0820 Rate: 81 Rhythm: normal sinus rhythm QRS Axis: normal Intervals: normal ST/T Wave abnormalities: normal Narrative Interpretation: no evidence of acute ischemia RADIOLOGY ED MD interpretation: 2 view chest x-ray interpreted by me shows no evidence of acute abnormalities including no pneumonia, pneumothorax, or widened mediastinum - All radiology independently interpreted and agree with radiology assessment Official radiology report(s): DG Chest 2 View Result Date: 05/03/2024 CLINICAL DATA:  Chest pain EXAM: CHEST - 2 VIEW COMPARISON:  None Available. FINDINGS: The heart size and mediastinal contours are within normal limits. Both lungs are clear. The visualized skeletal structures are unremarkable. IMPRESSION: No active cardiopulmonary disease. Electronically Signed   By: Fredrich Jefferson M.D.   On: 05/03/2024 08:37   PROCEDURES: Critical Care performed: No .1-3 Lead EKG Interpretation  Performed by: Charleen Conn, MD Authorized by: Charleen Conn, MD     Interpretation: normal  ECG rate:  81   ECG rate assessment: normal     Rhythm: sinus rhythm     Ectopy: none     Conduction: normal    MEDICATIONS ORDERED IN ED: Medications - No data to display IMPRESSION / MDM / ASSESSMENT AND PLAN / ED COURSE  I reviewed the triage vital signs and the nursing notes.                             The patient is on the cardiac monitor to evaluate for evidence of arrhythmia and/or significant heart rate changes. Patient's presentation is most consistent with acute presentation with potential threat to life or bodily function. 48 year old Workup: ECG,  CXR, CBC, BMP, Troponin Findings: ECG: No overt evidence of STEMI. No evidence of Brugada's sign, delta wave, epsilon wave, significantly prolonged QTc, or malignant arrhythmia HS Troponin: Negative x1 Other Labs unremarkable for emergent problems. CXR: Without PTX, PNA, or widened mediastinum Last Stress Test: Never Last Heart Catheterization: Never HEART Score: 3  Given History, Exam, and Workup I have low suspicion for ACS, Pneumothorax, Pneumonia, Pulmonary Embolus, Tamponade, Aortic Dissection or other emergent problem as a cause for this presentation.   Reassesment: Prior to discharge patient's pain was controlled and they were well appearing.  Disposition:  Discharge. Strict return precautions discussed with patient with full understanding. Advised patient to follow up promptly with primary care provider    FINAL CLINICAL IMPRESSION(S) / ED DIAGNOSES   Final diagnoses:  Left-sided chest pain  DOE (dyspnea on exertion)   Rx / DC Orders   ED Discharge Orders          Ordered    Ambulatory referral to Cardiology       Comments: If you have not heard from the Cardiology office within the next 72 hours please call [phone].   05/03/24 1012           Note:  This document was prepared using Dragon voice recognition software and may include unintentional dictation errors.   Edinson Domeier K, MD 05/03/24 1016

## 2024-05-03 NOTE — ED Notes (Signed)
 Dr Alejo Amsler stated it was okay for pt to be discharged without the second troponin drawn.

## 2024-05-03 NOTE — ED Triage Notes (Signed)
 Pt here with left sided cp since 0400. Pt states it radiates to her shoulder and it was constant until she took 2 aspirin and the pain lessened but she can still feel it. Pt had a little nausea this morning but none at the moment.

## 2024-05-03 NOTE — ED Notes (Signed)
 Patient transported to X-ray

## 2024-05-03 NOTE — Discharge Instructions (Addendum)
 Please use ibuprofen (Motrin) up to 800 mg every 8 hours, naproxen (Naprosyn) up to 500 mg every 12 hours, and/or acetaminophen (Tylenol) up to 4 g/day for any continued pain.  Please do not use this medication regimen for longer than 7 days

## 2024-05-04 ENCOUNTER — Other Ambulatory Visit: Payer: Self-pay | Admitting: Internal Medicine

## 2024-05-04 DIAGNOSIS — I10 Essential (primary) hypertension: Secondary | ICD-10-CM

## 2024-05-04 DIAGNOSIS — E782 Mixed hyperlipidemia: Secondary | ICD-10-CM

## 2024-06-01 ENCOUNTER — Other Ambulatory Visit

## 2024-06-01 ENCOUNTER — Ambulatory Visit
Admission: RE | Admit: 2024-06-01 | Discharge: 2024-06-01 | Disposition: A | Discharge status: Home / Self Care | Payer: Self-pay | Source: Ambulatory Visit | Attending: Internal Medicine | Admitting: Internal Medicine

## 2024-06-01 DIAGNOSIS — I10 Essential (primary) hypertension: Secondary | ICD-10-CM | POA: Insufficient documentation

## 2024-06-01 DIAGNOSIS — E782 Mixed hyperlipidemia: Secondary | ICD-10-CM | POA: Insufficient documentation

## 2024-06-12 ENCOUNTER — Other Ambulatory Visit: Payer: Self-pay | Admitting: Family Medicine

## 2024-06-12 DIAGNOSIS — Z1231 Encounter for screening mammogram for malignant neoplasm of breast: Secondary | ICD-10-CM

## 2024-06-30 ENCOUNTER — Ambulatory Visit
Admission: RE | Admit: 2024-06-30 | Discharge: 2024-06-30 | Disposition: A | Discharge status: Home / Self Care | Source: Ambulatory Visit | Attending: Family Medicine | Admitting: Family Medicine

## 2024-06-30 DIAGNOSIS — Z1231 Encounter for screening mammogram for malignant neoplasm of breast: Secondary | ICD-10-CM | POA: Diagnosis present
